# Patient Record
Sex: Male | Born: 1960
Health system: Southern US, Community
[De-identification: ages and names within clinical notes are randomized; demographics above are authoritative.]

## PROBLEM LIST (undated history)

## (undated) DIAGNOSIS — E119 Type 2 diabetes mellitus without complications: Secondary | ICD-10-CM

## (undated) DIAGNOSIS — I2510 Atherosclerotic heart disease of native coronary artery without angina pectoris: Secondary | ICD-10-CM

## (undated) HISTORY — PX: TONSILLECTOMY: SUR1361

---

## 2000-11-06 ENCOUNTER — Encounter: Payer: Self-pay | Admitting: Family Medicine

## 2005-11-29 ENCOUNTER — Ambulatory Visit: Payer: Self-pay | Admitting: Family Medicine

## 2006-01-10 ENCOUNTER — Ambulatory Visit: Payer: Self-pay | Admitting: Family Medicine

## 2006-07-19 ENCOUNTER — Telehealth: Payer: Self-pay | Admitting: Family Medicine

## 2006-07-19 ENCOUNTER — Encounter: Payer: Self-pay | Admitting: Family Medicine

## 2006-07-19 ENCOUNTER — Telehealth (INDEPENDENT_AMBULATORY_CARE_PROVIDER_SITE_OTHER): Payer: Self-pay | Admitting: *Deleted

## 2006-07-19 DIAGNOSIS — E7800 Pure hypercholesterolemia, unspecified: Secondary | ICD-10-CM

## 2006-07-30 ENCOUNTER — Encounter: Payer: Self-pay | Admitting: Family Medicine

## 2006-07-31 ENCOUNTER — Encounter: Payer: Self-pay | Admitting: Family Medicine

## 2006-07-31 LAB — CONVERTED CEMR LAB
Alkaline Phosphatase: 39 units/L (ref 39–117)
Indirect Bilirubin: 0.4 mg/dL (ref 0.0–0.9)
LDL Cholesterol: 92 mg/dL (ref 0–99)
Total Bilirubin: 0.5 mg/dL (ref 0.3–1.2)

## 2006-11-28 ENCOUNTER — Ambulatory Visit: Payer: Self-pay | Admitting: Family Medicine

## 2006-11-28 DIAGNOSIS — B079 Viral wart, unspecified: Secondary | ICD-10-CM | POA: Insufficient documentation

## 2006-11-28 DIAGNOSIS — B351 Tinea unguium: Secondary | ICD-10-CM

## 2006-12-16 ENCOUNTER — Telehealth: Payer: Self-pay | Admitting: Family Medicine

## 2007-09-11 ENCOUNTER — Ambulatory Visit: Payer: Self-pay | Admitting: Family Medicine

## 2007-09-11 DIAGNOSIS — J019 Acute sinusitis, unspecified: Secondary | ICD-10-CM

## 2007-10-02 ENCOUNTER — Telehealth: Payer: Self-pay | Admitting: Family Medicine

## 2007-10-30 ENCOUNTER — Telehealth (INDEPENDENT_AMBULATORY_CARE_PROVIDER_SITE_OTHER): Payer: Self-pay | Admitting: *Deleted

## 2007-10-30 ENCOUNTER — Encounter: Payer: Self-pay | Admitting: Family Medicine

## 2007-12-05 ENCOUNTER — Ambulatory Visit: Payer: Self-pay | Admitting: Family Medicine

## 2007-12-08 LAB — CONVERTED CEMR LAB
ALT: 32 units/L (ref 0–53)
Albumin: 4.6 g/dL (ref 3.5–5.2)
BUN: 20 mg/dL (ref 6–23)
CO2: 26 meq/L (ref 19–32)
Calcium: 9.6 mg/dL (ref 8.4–10.5)
Chloride: 106 meq/L (ref 96–112)
Cholesterol: 143 mg/dL (ref 0–200)
Creatinine, Ser: 1.23 mg/dL (ref 0.40–1.50)
Potassium: 4.5 meq/L (ref 3.5–5.3)
Total CHOL/HDL Ratio: 5.7

## 2008-01-12 ENCOUNTER — Telehealth: Payer: Self-pay | Admitting: Family Medicine

## 2008-09-30 ENCOUNTER — Ambulatory Visit: Payer: Self-pay | Admitting: Family Medicine

## 2008-09-30 DIAGNOSIS — J029 Acute pharyngitis, unspecified: Secondary | ICD-10-CM | POA: Insufficient documentation

## 2008-10-01 ENCOUNTER — Encounter: Payer: Self-pay | Admitting: Family Medicine

## 2008-10-26 ENCOUNTER — Ambulatory Visit: Payer: Self-pay | Admitting: Family Medicine

## 2008-12-14 ENCOUNTER — Encounter: Payer: Self-pay | Admitting: Family Medicine

## 2008-12-15 LAB — CONVERTED CEMR LAB
Albumin: 4.4 g/dL (ref 3.5–5.2)
CO2: 24 meq/L (ref 19–32)
Calcium: 9.5 mg/dL (ref 8.4–10.5)
Chloride: 105 meq/L (ref 96–112)
Cholesterol: 129 mg/dL (ref 0–200)
Glucose, Bld: 103 mg/dL — ABNORMAL HIGH (ref 70–99)
Sodium: 141 meq/L (ref 135–145)
Total Bilirubin: 0.4 mg/dL (ref 0.3–1.2)
Total Protein: 6.7 g/dL (ref 6.0–8.3)
Triglycerides: 248 mg/dL — ABNORMAL HIGH (ref <150)
VLDL: 50 mg/dL — ABNORMAL HIGH (ref 0–40)

## 2010-01-11 ENCOUNTER — Ambulatory Visit: Payer: Self-pay | Admitting: Family Medicine

## 2010-02-10 ENCOUNTER — Encounter: Payer: Self-pay | Admitting: Family Medicine

## 2010-02-10 LAB — CONVERTED CEMR LAB
BUN: 23 mg/dL
CO2: 24 meq/L
Chloride: 103 meq/L
Cholesterol: 127 mg/dL
Glucose, Bld: 124 mg/dL
HDL: 23 mg/dL
LDL Cholesterol: 67 mg/dL
Potassium: 4.4 meq/L
Sodium: 138 meq/L
Triglycerides: 187 mg/dL

## 2010-02-14 ENCOUNTER — Encounter: Payer: Self-pay | Admitting: Family Medicine

## 2010-08-08 NOTE — Assessment & Plan Note (Signed)
Summary: [employer] physical   Vital Signs:  Patient profile:   50 year old male Height:      69 inches Weight:      263.75 pounds BMI:     39.09 Temp:     97.2 degrees F oral Pulse rate:   69 / minute Pulse rhythm:   regular Resp:     18 per minute BP sitting:   113 / 72  (right arm) Cuff size:   large  Vitals Entered By: Mervin Kung CMA Duncan Dull) (January 11, 2010 2:36 PM) CC: Room 2  Pt here for physical, non-fasting.  Has [employer] form for completion. Pt needs refills on Tricor and Lipitor. Is Patient Diabetic? No   Primary Care Provider:  Nani Gasser MD  CC:  Room 2  Pt here for physical and non-fasting.  Has [employer] form for completion. 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  Denies any problems.  He is due for fasting labs.  He takes meds for hyperlipidemia.  Denies any cardiac or pulmonary hx.  His tetanus was updated last year.  He is obese.    Denies fam hx of colon or prostate cancer or of premature heart dz.    Needs meds RFd today and form completed.  Allergies (verified): No Known Drug Allergies  Past History:  Past Medical History: Reviewed history from 10/26/2008 and no changes required. Tonsillecotmy at age 52.   Past Surgical History: Reviewed history from 11/28/2006 and no changes required. None  Family History: Reviewed history from 10/26/2008 and no changes required. Mother with COPD, smoker Father with COPD, smoker.  MGM with MI, > 60, died.   Social History: Reviewed history from 10/26/2008 and no changes required. 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 Has 1 child.   Never Smoked Alcohol use-yes Drug use-no Regular exercise-no  Review of Systems  The patient denies anorexia, fever, weight loss, weight gain, vision loss, decreased hearing, hoarseness, chest pain, syncope, dyspnea on exertion, peripheral edema,  prolonged cough, headaches, hemoptysis, abdominal pain, melena, hematochezia, severe indigestion/heartburn, hematuria, incontinence, genital sores, muscle weakness, suspicious skin lesions, transient blindness, difficulty walking, depression, unusual weight change, abnormal bleeding, enlarged lymph nodes, angioedema, breast masses, and testicular masses.    Physical Exam  General:  obese WM in NAD Head:  normocephalic, atraumatic, and no alopecia.   Eyes:  pupils equal, pupils round, and pupils reactive to light.   Ears:  EACs patent; TMs translucent and gray with good cone of light and bony landmarks.  Nose:  no nasal discharge.   Mouth:  good dentition and pharynx pink and moist.   Neck:  no masses.   Lungs:  Normal respiratory effort, chest expands symmetrically. Lungs are clear to auscultation, no crackles or wheezes. Heart:  Normal rate and regular rhythm. S1 and S2 normal without gallop, murmur, click, rub or other extra sounds. Abdomen:  Bowel sounds positive,abdomen soft and non-tender without masses, organomegaly Msk:  no joint swelling, no joint warmth, and no redness over joints.   Pulses:  2+ radlal and PT pulses Extremities:  no LE edema Neurologic:  gait normal and DTRs symmetrical and normal.   Skin:  color normal.   Cervical Nodes:  No lymphadenopathy noted Psych:  good eye contact, not anxious appearing, and not depressed appearing.     Impression & Recommendations:  Problem # 1:  OTH GENERAL  MEDICAL EXAMINATION ADMIN PURPOSES (ICD-V70.3) [employer] physical performed.  Form completed. BP at goal.  BMI 39= class II obesity. Tdap given 2010. Update fasting labs. RFd meds.  Complete Medication List: 1)  Tricor 145 Mg Tabs (Fenofibrate) .... Take 1 tablet by mouth once a day 2)  Lipitor 40 Mg Tabs (Atorvastatin calcium) .... Take 1 tablet by mouth once a day at bedtime 3)  Fish Oil 1000 Mg Caps (Omega-3 fatty acids) .... Take 2 capsules by mouth daily. 4)  Mv  ....  By mouth daily  Other Orders: T-Comprehensive Metabolic Panel [id number]) T-Lipid Profile ([id number]) Prescriptions: LIPITOR 40 MG TABS (ATORVASTATIN CALCIUM) Take 1 tablet by mouth once a day at bedtime  #90 x 3   Entered and Authorized by:   Seymour Bars DO   Signed by:   Seymour Bars DO on 01/11/2010   Method used:   Faxed to ...       [facility]) (mail-order)             , [place]    [place]       Ph:        Fax: [phone]   RxID:   [id number] TRICOR 145 MG TABS (FENOFIBRATE) Take 1 tablet by mouth once a day  #90 x 3   Entered and Authorized by:   Seymour Bars DO   Signed by:   Seymour Bars DO on 01/11/2010   Method used:   Faxed to ...       [facility]) (mail-order)             , [place]    [place]       Ph:        Fax: [phone]   RxID:   [id number]   Current Allergies (reviewed today): No known allergies

## 2010-12-17 ENCOUNTER — Encounter: Payer: Self-pay | Admitting: Family Medicine

## 2010-12-21 ENCOUNTER — Ambulatory Visit (INDEPENDENT_AMBULATORY_CARE_PROVIDER_SITE_OTHER): Payer: Commercial Indemnity | Admitting: Family Medicine

## 2010-12-21 ENCOUNTER — Encounter: Payer: Self-pay | Admitting: Family Medicine

## 2010-12-21 VITALS — BP 123/73 | HR 61 | Ht 68.5 in | Wt 258.0 lb

## 2010-12-21 DIAGNOSIS — Z0000 Encounter for general adult medical examination without abnormal findings: Secondary | ICD-10-CM

## 2010-12-21 MED ORDER — FENOFIBRATE 145 MG PO TABS: 145.0000 mg | ORAL_TABLET | Freq: Every day | ORAL | Status: DC

## 2010-12-21 MED ORDER — ATORVASTATIN CALCIUM 40 MG PO TABS: 40.0000 mg | ORAL_TABLET | Freq: Every day | ORAL | Status: DC

## 2010-12-21 NOTE — Patient Instructions (Signed)
Work on weight loss, healthy diet, and exercise!!!

## 2010-12-21 NOTE — Progress Notes (Signed)
Subjective:    Patient ID: Robert Ward, male    DOB: June 06, 1961, 50 y.o.   MRN: [id number]  HPI He has no specific concerns today that he is here for complete physical. He does have a form for [employer] [place] that also needs to be completed. He does wear contact lenses and he says that his prescription is up-to-date. No heaves also been out of his cholesterol medications for a couple of weeks. He says they were not refilled. Otherwise he tolerates them well and denies any myalgias.  Review of Systems  No chest pain, shortness of breath, vision or hearing changes, blood in the urine or stool, abdominal pain, skin changes, or abnormal lumps or bumps.  BP 123/73  Pulse 61  Ht 5' 8.5" (1.74 m)  Wt 258 lb (117.028 kg)  BMI 38.66 kg/m2  SpO2 96%    Not on File  No past medical history on file.  Past Surgical History  Procedure Date  . Tonsillectomy 5    History   Social History  . Marital Status: Married    Spouse Name: N/A    Number of Children: 1   . Years of Education: N/A   Occupational History  . Not on file.   Social History Main Topics  . Smoking status: Never Smoker   . Smokeless tobacco: Not on file  . Alcohol Use: Yes  . Drug Use: No  . Sexually Active:      works as a Nature conservation officer, not married, 1 child, doesn't regularly exercise.   Other Topics Concern  . Not on file   Social History Narrative   1 caffeine drink per day. No regular exercise.      Family History  Problem Relation Age of Onset  . COPD Mother     + smoker  . COPD Father     + smoker, died 24  . Heart attack Maternal Grandmother 60    Current outpatient prescriptions:fish oil-omega-3 fatty acids 1000 MG capsule, Take 1 g by mouth 2 (two) times daily.  , Disp: , Rfl: ;  Multiple Vitamin (MULTIVITAMIN) tablet, Take 1 tablet by mouth daily.  , Disp: , Rfl: ;  atorvastatin (LIPITOR) 40 MG tablet, Take 40 mg by mouth daily.  , Disp: , Rfl: ;  fenofibrate (TRICOR) 145 MG  tablet, Take 145 mg by mouth daily.  , Disp: , Rfl:      Objective:   Physical Exam  Constitutional: He is oriented to person, place, and time. He appears well-developed and well-nourished.       Obese.   HENT:  Head: Normocephalic and atraumatic.  Right Ear: External ear normal.  Left Ear: External ear normal.  Nose: Nose normal.  Mouth/Throat: Oropharynx is clear and moist.  Eyes: Conjunctivae and EOM are normal. Pupils are equal, round, and reactive to light.  Neck: Normal range of motion. Neck supple. No thyromegaly present.  Cardiovascular: Normal rate, regular rhythm, normal heart sounds and intact distal pulses.   Pulmonary/Chest: Effort normal and breath sounds normal.  Abdominal: Soft. Bowel sounds are normal. He exhibits no distension and no mass. There is no tenderness. There is no rebound and no guarding.  Musculoskeletal: Normal range of motion. He exhibits no edema and no tenderness.       Neck with normal range of motion. Upper and lower images with normal range of motion and strength 5 out of 5. Back with normal range of motion.  Lymphadenopathy:  He has no cervical adenopathy.  Neurological: He is alert and oriented to person, place, and time. He has normal reflexes.  Skin: Skin is warm and dry.  Psychiatric: He has a normal mood and affect. His behavior is normal. Judgment and thought content normal.          Assessment & Plan:  CPE - Due for screening labs. He was given a lab slip indicating that any day he would like. Vaccines are uptodate.  Work on [employer] loss, healhy diet and regular exercise. I completed his form for [employer]. He does not meet the requirement but otherwise is physically able to participate.

## 2011-03-23 ENCOUNTER — Inpatient Hospital Stay (INDEPENDENT_AMBULATORY_CARE_PROVIDER_SITE_OTHER)
Admission: RE | Admit: 2011-03-23 | Discharge: 2011-03-23 | Disposition: A | Discharge status: Home / Self Care | Payer: Commercial Indemnity | Source: Ambulatory Visit | Attending: Emergency Medicine | Admitting: Emergency Medicine

## 2011-03-23 ENCOUNTER — Encounter: Payer: Self-pay | Admitting: Emergency Medicine

## 2011-03-23 DIAGNOSIS — R059 Cough, unspecified: Secondary | ICD-10-CM

## 2011-03-23 DIAGNOSIS — R05 Cough: Secondary | ICD-10-CM

## 2011-03-23 DIAGNOSIS — J069 Acute upper respiratory infection, unspecified: Secondary | ICD-10-CM

## 2011-03-23 DIAGNOSIS — E785 Hyperlipidemia, unspecified: Secondary | ICD-10-CM | POA: Insufficient documentation

## 2011-06-11 NOTE — Progress Notes (Signed)
Summary: Sore throat/stuff head /cough   Vital Signs:  Patient Profile:   50 Years Old Male CC:      sore throat, congestion, Ha x 2 days.  Height:     69 inches (175.26 cm) Weight:      260.25 pounds O2 Sat:      97 % O2 treatment:    Room Air Temp:     98.9 degrees F oral Pulse rate:   79 / minute Resp:     16 per minute BP sitting:   98 / 62  (left arm) Cuff size:   large  Vitals Entered By: Clemens Catholic LPN (March 23, 2011 9:53 AM)                  Updated Prior Medication List: TRICOR 145 MG TABS (FENOFIBRATE) Take 1 tablet by mouth once a day LIPITOR 40 MG TABS (ATORVASTATIN CALCIUM) Take 1 tablet by mouth once a day at bedtime FISH OIL 1000 MG CAPS (OMEGA-3 FATTY ACIDS) Take 2 capsules by mouth daily. * MV by mouth daily  Current Allergies (reviewed today): No known allergies History of Present Illness Chief Complaint: sore throat, congestion, Ha x 2 days.  History of Present Illness: 50 Years Old Male complains of onset of cold symptoms for 2 days.  JESSUP has been using Mucinex which is helping a little bit.  Wife is sick with bronchitis and is on Avelox which is helping her. + sore throat + cough No pleuritic pain No wheezing +nasal congestion + post-nasal drainage + sinus pain/pressure No chest congestion No itchy/red eyes No earache No hemoptysis No SOB No chills/sweats No fever No nausea No vomiting No abdominal pain No diarrhea No skin rashes No fatigue No myalgias No headache   REVIEW OF SYSTEMS Constitutional Symptoms       Complains of fatigue.     Denies fever, chills, night sweats, weight loss, and weight gain.  Eyes       Denies change in vision, eye pain, eye discharge, glasses, contact lenses, and eye surgery. Ear/Nose/Throat/Mouth       Complains of sore throat and hoarseness.      Denies hearing loss/aids, change in hearing, ear pain, ear discharge, dizziness, frequent runny nose, frequent nose bleeds, sinus problems, and  tooth pain or bleeding.  Respiratory       Complains of dry cough.      Denies productive cough, wheezing, shortness of breath, asthma, bronchitis, and emphysema/COPD.  Cardiovascular       Denies murmurs, chest pain, and tires easily with exhertion.    Gastrointestinal       Denies stomach pain, nausea/vomiting, diarrhea, constipation, blood in bowel movements, and indigestion. Genitourniary       Denies painful urination, kidney stones, and loss of urinary control. Neurological       Complains of headaches.      Denies paralysis, seizures, and fainting/blackouts. Musculoskeletal       Denies muscle pain, joint pain, joint stiffness, decreased range of motion, redness, swelling, muscle weakness, and gout.  Skin       Denies bruising, unusual mles/lumps or sores, and hair/skin or nail changes.  Psych       Denies mood changes, temper/anger issues, anxiety/stress, speech problems, depression, and sleep problems. Other Comments: pt c/o sore throat, congestion, Ha x 2 days. no fever. he has taken a herbal supp to help with congestion.   Past History:  Past Medical History:  Hyperlipidemia  Past Surgical  History:  Tonsillectomy  Family History: Reviewed history from 10/26/2008 and no changes required. Mother with COPD, smoker Father with COPD, smoker.  MGM with MI, > 60, died.   Social History: Reviewed history from 10/26/2008 and no changes required. 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 Has 1 child.   Never Smoked Alcohol use-yes Drug use-no Regular exercise-no Physical Exam General appearance: well developed, well nourished, no acute distress Ears: cerumen bilateral Nasal: mucosa pink, nonedematous, no septal deviation, turbinates normal Oral/Pharynx: clear PND, no erythema Chest/Lungs: no rales, wheezes, or rhonchi bilateral, breath sounds equal without effort Heart: regular rate and  rhythm, no murmur MSE: oriented to time,  place, and person Assessment New Problems: UPPER RESPIRATORY INFECTION, ACUTE (ICD-465.9) COUGH (ICD-786.2) HYPERLIPIDEMIA (ICD-272.4)   Plan New Medications/Changes: AMOXICILLIN 875 MG TABS (AMOXICILLIN) 1 by mouth two times a day for 7 days  #14 x 0, 03/23/2011, Hoyt Koch MD  New Orders: New Patient Level III [id number] Pulse Oximetry (single measurment) [94760] Rapid Strep [87880] T-Culture, Throat [[id number]] Planning Comments:   1)  Take the prescribed antibiotic as instructed.  Hold for a few days since this is most likely viral. 2)  Use nasal saline solution (over the counter) at least 3 times a day. 3)  Use over the counter decongestants like Zyrtec-D every 12 hours as needed to help with congestion. 4)  Can take tylenol every 6 hours or motrin every 8 hours for pain or fever. 5)  Follow up with your primary doctor  if no improvement in 5-7 days, sooner if increasing pain, fever, or new symptoms.    The patient and/or caregiver has been counseled thoroughly with regard to medications prescribed including dosage, schedule, interactions, rationale for use, and possible side effects and they verbalize understanding.  Diagnoses and expected course of recovery discussed and will return if not improved as expected or if the condition worsens. Patient and/or caregiver verbalized understanding.  Prescriptions: AMOXICILLIN 875 MG TABS (AMOXICILLIN) 1 by mouth two times a day for 7 days  #14 x 0   Entered and Authorized by:   Hoyt Koch MD   Signed by:   Hoyt Koch MD on 03/23/2011   Method used:   Print then Give to Patient   RxID:   [id number]   Orders Added: 1)  New Patient Level III [40102] 2)  Pulse Oximetry (single measurment) [94760] 3)  Rapid Strep [72536] 4)  T-Culture, Throat [[id number]]    Laboratory Results  Date/Time Received: March 23, 2011 9:56 AM  Date/Time Reported: March 23, 2011 9:56 AM   Other Tests  Rapid Strep:  negative  Kit Test Internal QC: Negative   (Normal Range: Negative)

## 2011-11-29 ENCOUNTER — Ambulatory Visit (INDEPENDENT_AMBULATORY_CARE_PROVIDER_SITE_OTHER): Payer: Commercial Indemnity | Admitting: Family Medicine

## 2011-11-29 ENCOUNTER — Encounter: Payer: Self-pay | Admitting: Family Medicine

## 2011-11-29 VITALS — BP 113/72 | HR 81 | Temp 98.7°F | Ht 68.5 in | Wt 240.0 lb

## 2011-11-29 DIAGNOSIS — R197 Diarrhea, unspecified: Secondary | ICD-10-CM

## 2011-11-29 NOTE — Progress Notes (Signed)
  Subjective:    Patient ID: Robert Ward, male    DOB: [birth year], 51 y.o.   MRN: [id number]  HPI Sat night went to [employer]. Diarrhea started Sunday morning.  Some cramping initially.  4-5 BMs per day. Stools are watery and then more mushy and now getting watery again.  Never had a fever.  No nausea.  No blood in the stool.  Took some immodium initially but not in 3 days. Went camping 2 weeks ago but didn't drink out of creek or lake.  Not been visiting hospitals.  No recent ABX. No raidation of pain into the back.    Review of Systems     Objective:   Physical Exam  Constitutional: He is oriented to person, place, and time. He appears well-developed and well-nourished.  HENT:  Head: Normocephalic and atraumatic.  Cardiovascular: Normal rate, regular rhythm and normal heart sounds.   Pulmonary/Chest: Effort normal and breath sounds normal.  Abdominal: Soft. Bowel sounds are normal. He exhibits no distension and no mass. There is tenderness. There is no rebound and no guarding.       TEnder RUQ.    Neurological: He is alert and oriented to person, place, and time.  Skin: Skin is warm and dry.  Psychiatric: He has a normal mood and affect. His behavior is normal.          Assessment & Plan:  Diarrhea - likely bacterial from contamination of food. We will do stool culture and a CBC today. If these are fairly normal then we could consider treating him with a round of Cipro for 5 days. Is not having any blood in the stools it is less likely that he has shigella or salmonella. Continue to work on staying hydrated and can eat more binding type II such as rice. Avoid products like immodium.   RUQ tenderness-he has no other symptoms consistent with gallbladder disease but if he is not better or develops abdominal pain or nausea or fever consider further evaluation with a right upper quadrant ultrasound.

## 2011-11-29 NOTE — Patient Instructions (Signed)
We will call you with your lab results. If you don't here from [place] in about a week then please give [place] a call at [phone]. Call if fever or blood in the stool.

## 2011-11-30 ENCOUNTER — Other Ambulatory Visit: Payer: Self-pay | Admitting: Family Medicine

## 2011-11-30 LAB — CBC WITH DIFFERENTIAL/PLATELET
Basophils Relative: 0 % (ref 0–1)
Eosinophils Absolute: 0.3 10*3/uL (ref 0.0–0.7)
Eosinophils Relative: 2 % (ref 0–5)
HCT: 45 % (ref 39.0–52.0)
Hemoglobin: 15.5 g/dL (ref 13.0–17.0)
MCH: 30 pg (ref 26.0–34.0)
MCHC: 34.4 g/dL (ref 30.0–36.0)
MCV: 87.2 fL (ref 78.0–100.0)
Monocytes Absolute: 1 10*3/uL (ref 0.1–1.0)
Monocytes Relative: 8 % (ref 3–12)
Neutrophils Relative %: 63 % (ref 43–77)

## 2011-11-30 MED ORDER — CIPROFLOXACIN HCL 500 MG PO TABS: 500.0000 mg | ORAL_TABLET | Freq: Two times a day (BID) | ORAL | Status: AC

## 2012-01-11 ENCOUNTER — Encounter: Payer: Self-pay | Admitting: Family Medicine

## 2012-01-11 ENCOUNTER — Ambulatory Visit (INDEPENDENT_AMBULATORY_CARE_PROVIDER_SITE_OTHER): Payer: Commercial Indemnity | Admitting: Family Medicine

## 2012-01-11 VITALS — BP 105/67 | HR 57 | Ht 68.5 in | Wt 247.0 lb

## 2012-01-11 DIAGNOSIS — E669 Obesity, unspecified: Secondary | ICD-10-CM

## 2012-01-11 DIAGNOSIS — Z0000 Encounter for general adult medical examination without abnormal findings: Secondary | ICD-10-CM

## 2012-01-11 DIAGNOSIS — Z1211 Encounter for screening for malignant neoplasm of colon: Secondary | ICD-10-CM

## 2012-01-11 LAB — LIPID PANEL: LDL Cholesterol: 138 mg/dL

## 2012-01-11 LAB — BASIC METABOLIC PANEL
BUN: 23 mg/dL — AB (ref 4–21)
Glucose: 105 mg/dL

## 2012-01-11 LAB — HEPATIC FUNCTION PANEL
AST: 20 U/L (ref 14–40)
Alkaline Phosphatase: 68 U/L (ref 25–125)
Bilirubin, Total: 0.4 mg/dL

## 2012-01-11 LAB — PSA: Total Bilirubin: 0.4 mg/dL

## 2012-01-11 NOTE — Progress Notes (Signed)
Subjective:    Patient ID: Robert Ward, male    DOB: 07-31-1960, 51 y.o.   MRN: [id number]  HPI complete physical examination He is doing well. He has no problems. He did bring a form for [employer] to be completed today. His tetanus is up-to-date. He has turned 51. He is never had a screening colonoscopy. He would like to have the blood test or PSA but not a digital rectal exam. He denies any urinary problems.   Review of Systems Comprehensive review of systems negative. BP 105/67  Pulse 57  Ht 5' 8.5" (1.74 m)  Wt 247 lb (112.038 kg)  BMI 37.01 kg/m2    No Known Allergies  History reviewed. No pertinent past medical history.  Past Surgical History  Procedure Date  . Tonsillectomy 5    History   Social History  . Marital Status: Married    Spouse Name: N/A    Number of Children: 1   . Years of Education: N/A   Occupational History  . Engineer     [employer]   Social History Main Topics  . Smoking status: Never Smoker   . Smokeless tobacco: Not on file  . Alcohol Use: Yes  . Drug Use: No  . Sexually Active: Yes -- Male partner(s)     works as a Nature conservation officer, not married, 1 child, doesn't regularly exercise.   Other Topics Concern  . Not on file   Social History Narrative   1 caffeine drink per day. Some regular exercise.      Family History  Problem Relation Age of Onset  . COPD Mother     + smoker  . COPD Father     + smoker, died 64  . Heart attack Maternal Grandmother 60    Outpatient Encounter Prescriptions as of 01/11/2012  Medication Sig Dispense Refill  . fish oil-omega-3 fatty acids 1000 MG capsule Take 1 g by mouth 2 (two) times daily.        . Multiple Vitamin (MULTIVITAMIN) tablet Take 1 tablet by mouth daily.        Marland Kitchen DISCONTD: atorvastatin (LIPITOR) 40 MG tablet Take 1 tablet (40 mg total) by mouth daily.  90 tablet  3  . DISCONTD: fenofibrate (TRICOR) 145 MG tablet Take 1 tablet (145 mg total) by mouth daily.  90 tablet  3           Objective:   Physical Exam  Constitutional: He is oriented to person, place, and time. He appears well-developed and well-nourished.       Obese   HENT:  Head: Normocephalic and atraumatic.  Right Ear: External ear normal.  Left Ear: External ear normal.  Nose: Nose normal.  Mouth/Throat: Oropharynx is clear and moist.  Eyes: Conjunctivae and EOM are normal. Pupils are equal, round, and reactive to light.  Neck: Normal range of motion. Neck supple. No thyromegaly present.  Cardiovascular: Normal rate, regular rhythm, normal heart sounds and intact distal pulses.   Pulmonary/Chest: Effort normal and breath sounds normal.  Abdominal: Soft. Bowel sounds are normal. He exhibits no distension and no mass. There is no tenderness. There is no rebound and no guarding.  Musculoskeletal: Normal range of motion. He exhibits no edema.  Lymphadenopathy:    He has no cervical adenopathy.  Neurological: He is alert and oriented to person, place, and time. He has normal reflexes.  Skin: Skin is warm and dry.  Psychiatric: He has a normal mood and affect. His behavior  is normal. Judgment and thought content normal.          Assessment & Plan:  CPE -  Doing well. Start a regular exercise program and make sure you are eating a healthy diet Try to eat 4 servings of dairy a day Your vaccines are up to date.  Form completed.  Well overdue for screening labs.   Discussed need for colon cancer screening. We discussed the options. He is okay with getting a colonoscopy.

## 2012-01-11 NOTE — Patient Instructions (Addendum)
We will call you with your lab results. If you don't here from us in about a week then please give us a call at [phone].  

## 2012-01-16 ENCOUNTER — Telehealth: Payer: Self-pay | Admitting: Family Medicine

## 2012-01-16 ENCOUNTER — Telehealth: Payer: Self-pay | Admitting: *Deleted

## 2012-01-16 NOTE — Telephone Encounter (Signed)
Left message on vm

## 2012-01-16 NOTE — Telephone Encounter (Signed)
Call patient: Glucose was borderline elevated at 105. Kidney function was stable. Collect lites were fairly normal. Liver enzymes were normal. Cholesterol was high. Total cholesterol was 215, LDL is 138. Normal Center 100. His HDL which is the good cholesterol was low. He really needs to be back on a cholesterol-lowering medication at bedtime. He did not like Lipitor we could certainly try something different. Please let me know what he would like to do. This would be in addition to low fat diet regular exercise and weight loss. His triglycerides were high at 241. Prostate test was normal.

## 2012-01-16 NOTE — Telephone Encounter (Signed)
Pt states whatever you put him on for his cholesterol will be fine.

## 2012-01-17 ENCOUNTER — Encounter: Payer: Self-pay | Admitting: *Deleted

## 2012-01-17 MED ORDER — PRAVASTATIN SODIUM 20 MG PO TABS: 20.0000 mg | ORAL_TABLET | Freq: Every day | ORAL | Status: DC

## 2012-01-17 NOTE — Telephone Encounter (Signed)
LMOM

## 2012-01-17 NOTE — Telephone Encounter (Signed)
rx sent to pharm.  Recheck lipids in 8 weeks.

## 2012-01-30 LAB — HM COLONOSCOPY

## 2012-01-31 ENCOUNTER — Encounter: Payer: Self-pay | Admitting: *Deleted

## 2012-02-07 ENCOUNTER — Encounter: Payer: Self-pay | Admitting: *Deleted

## 2012-02-21 ENCOUNTER — Ambulatory Visit (INDEPENDENT_AMBULATORY_CARE_PROVIDER_SITE_OTHER): Payer: Commercial Indemnity | Admitting: Sports Medicine

## 2012-02-21 ENCOUNTER — Encounter: Payer: Self-pay | Admitting: Sports Medicine

## 2012-02-21 VITALS — BP 109/58 | Temp 98.4°F | Wt 245.0 lb

## 2012-02-21 DIAGNOSIS — H6120 Impacted cerumen, unspecified ear: Secondary | ICD-10-CM

## 2012-02-21 NOTE — Progress Notes (Signed)
Patient ID: Robert Ward, male   DOB: 29-Jul-1960, 51 y.o.   MRN: [id number] Subjective:    CC: ears clogged up.  HPI: Robert Ward comes in, he is a very pleasant 51 year old male, but unfortunately has had an experience of both ears being clogged, and lack of hearing. He tried to clean them out earlier today, but this was ineffective. He denies any prior URI type symptoms.  His earsare mildly painful, there is no radiation to the symptoms.  He denies any headache. He also denies placing any objects into his ear. He also denies drainage.  Past medical history, Surgical history, Family history, Social history, Allergies, and medications have been entered into the medical record, reviewed, and no changes needed.   Review of Systems: No fevers, chills, night sweats, weight loss, chest pain, or shortness of breath.   Objective:    General: Well Developed, well nourished, and in no acute distress.  Neuro: Alert and oriented x3, extra-ocular muscles intact.  HEENT: Normocephalic, atraumatic, pupils equal round reactive to light, neck supple, no masses, no lymphadenopathy, thyroid nonpalpable. Both ears with cerumen impactions. Skin: Warm and dry, no rashes. Cardiac: Regular rate and rhythm, no murmurs rubs or gallops.  Respiratory: Clear to auscultation bilaterally. Not using accessory muscles, speaking in full sentences.  External canals irrigated, and cleared.  I did end up needing to remove a small amount of the cerumen with a curette on both sides.  His hearing returned completely to baseline.  Impression and Recommendations:

## 2012-02-21 NOTE — Assessment & Plan Note (Signed)
Irrigated both canals. Cerumen removed. Hearing resolved.

## 2012-05-17 ENCOUNTER — Other Ambulatory Visit: Payer: Self-pay | Admitting: Family Medicine

## 2012-08-29 ENCOUNTER — Encounter: Payer: Self-pay | Admitting: Family Medicine

## 2012-08-29 ENCOUNTER — Ambulatory Visit (INDEPENDENT_AMBULATORY_CARE_PROVIDER_SITE_OTHER): Payer: BC Managed Care – PPO

## 2012-08-29 ENCOUNTER — Ambulatory Visit (INDEPENDENT_AMBULATORY_CARE_PROVIDER_SITE_OTHER): Payer: Commercial Indemnity | Admitting: Family Medicine

## 2012-08-29 VITALS — BP 118/63 | HR 65 | Ht 68.0 in | Wt 247.0 lb

## 2012-08-29 NOTE — Progress Notes (Signed)
Subjective:    Patient ID: Robert Ward, male    DOB: [birth year], 52 y.o.   MRN: [id number]  HPI Left shoulder pain and dec ROM x 2 weeks.  No known old o new injuries.  Pain with movement on outer and back of shoulder. Pain is an aching.  Gets worse as he reaches up.  Can reach behind just fine.  No OTC meds. Not iced it.  No surgery. No neck pain.  Some pain on the medial side of elbow.    Review of Systems BP 118/63  Pulse 65  Ht 5\' 8"  (1.727 m)  Wt 247 lb (112.038 kg)  BMI 37.56 kg/m2    No Known Allergies  No past medical history on file.  Past Surgical History  Procedure Laterality Date  . Tonsillectomy  5    History   Social History  . Marital Status: Married    Spouse Name: N/A    Number of Children: 1   . Years of Education: N/A   Occupational History  . Engineer     [employer]   Social History Main Topics  . Smoking status: Never Smoker   . Smokeless tobacco: Not on file  . Alcohol Use: Yes  . Drug Use: No  . 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   Other Topics Concern  . Not on file   Social History Narrative   1 caffeine drink per day. Some regular exercise.      Family History  Problem Relation Age of Onset  . COPD Mother     + smoker  . COPD Father     + smoker, died 64  . Heart attack Maternal Grandmother 60    Outpatient Encounter Prescriptions as of 08/29/2012  Medication Sig Dispense Refill  . fish oil-omega-3 fatty acids 1000 MG capsule Take 1 g by mouth 2 (two) times daily.        . Multiple Vitamin (MULTIVITAMIN) tablet Take 1 tablet by mouth daily.        . pravastatin (PRAVACHOL) 20 MG tablet TAKE 1 TABLET DAILY  30 tablet  2   No facility-administered encounter medications on file as of 08/29/2012.          Objective:   Physical Exam  Constitutional: He is oriented to person, place, and time. He appears well-developed and  well-nourished.  Musculoskeletal:  Left shoulder with normal internal and external rotation. He has extension to about 100. He is nontender of the shoulder joint itself. No apparent swelling or dislocation of the bones. Neck with normal range of motion. Strength is 5 out of 5 in all directions. Negative empty can test. No significant discomfort with internal and external rotation. Hand strength is symmetric.  Neurological: He is alert and oriented to person, place, and time.  Skin: Skin is warm and dry.  Psychiatric: He has a normal mood and affect. His behavior is normal.          Assessment & Plan:  Left shoulder pain- he also has decreased range of motion. He can test is normal there. Consider rotator cuff injury but his strength is actually very good. Also consider first and shoulder that typically there is limited range of motion in all directions not just with extension. I would like to start with getting an x-ray to look at the anatomy and to see if there's any significant arthritis  in the shoulder. At least over the weekend I would like him to take an anti-inflammatory either naproxen or ibuprofen over-the-counter. Also showed him some exercises and range of motion stretches for his shoulder. We'll call with the results on Monday.

## 2012-08-29 NOTE — Patient Instructions (Addendum)
Recommend naproxe twice a day or Ibuprofen 600mg  three times a day. Make sure to take with food and water.

## 2012-09-02 ENCOUNTER — Encounter: Payer: Self-pay | Admitting: Family Medicine

## 2012-09-08 ENCOUNTER — Ambulatory Visit (INDEPENDENT_AMBULATORY_CARE_PROVIDER_SITE_OTHER): Payer: BC Managed Care – PPO | Admitting: Sports Medicine

## 2012-09-08 DIAGNOSIS — M7542 Impingement syndrome of left shoulder: Secondary | ICD-10-CM | POA: Insufficient documentation

## 2012-09-08 DIAGNOSIS — M25819 Other specified joint disorders, unspecified shoulder: Secondary | ICD-10-CM

## 2012-09-08 NOTE — Progress Notes (Signed)
  Subjective:    I'm seeing this patient as a consultation for:  Dr. Linford Arnold  CC: Left shoulder pain  HPI: Pain localized over the deltoid for several months, worse with overhead activities, no radiation down to the hands no numbness or pain, no neck pain, has used an occasional ibuprofen, has not had any therapies. No trauma. Moderate.  Past medical history, Surgical history, Family history not pertinant except as noted below, Social history, Allergies, and medications have been entered into the medical record, reviewed, and no changes needed.   Review of Systems: No headache, visual changes, nausea, vomiting, diarrhea, constipation, dizziness, abdominal pain, skin rash, fevers, chills, night sweats, weight loss, swollen lymph nodes, body aches, joint swelling, muscle aches, chest pain, shortness of breath, mood changes, visual or auditory hallucinations.   Objective:   General: Well Developed, well nourished, and in no acute distress.  Neuro/Psych: Alert and oriented x3, extra-ocular muscles intact, able to move all 4 extremities, sensation grossly intact. Skin: Warm and dry, no rashes noted.  Respiratory: Not using accessory muscles, speaking in full sentences, trachea midline.  Cardiovascular: Pulses palpable, no extremity edema. Abdomen: Does not appear distended. Left Shoulder: Inspection reveals no abnormalities, atrophy or asymmetry. Palpation is normal with no tenderness over AC joint or bicipital groove. ROM is full in all planes. Rotator cuff strength weak to abduction. Positive Neer's, Hawkins, and Empty Can signs. Speeds and Yergason's tests normal. No labral pathology noted with negative Obrien's, negative clunk and good stability. Normal scapular function observed. No painful arc and no drop arm sign. No apprehension sign. Impression and Recommendations:   This case required medical decision making of moderate complexity.

## 2012-09-08 NOTE — Assessment & Plan Note (Signed)
Continue ibuprofen prescribed by primary care physician. One visit with the physical therapist to teach exercises, and home exercises and working specifically on the rotator cuff with his personal trainer. He should avoid overhead exercises. Return to see me in 4 weeks, injection if no better.

## 2012-09-17 ENCOUNTER — Encounter: Payer: Self-pay | Admitting: Sports Medicine

## 2012-09-19 ENCOUNTER — Other Ambulatory Visit: Payer: Self-pay

## 2012-09-19 MED ORDER — PRAVASTATIN SODIUM 20 MG PO TABS: 20.0000 mg | ORAL_TABLET | Freq: Every day | ORAL | Status: DC

## 2012-09-24 ENCOUNTER — Ambulatory Visit (INDEPENDENT_AMBULATORY_CARE_PROVIDER_SITE_OTHER): Payer: BC Managed Care – PPO | Admitting: Physical Therapy

## 2012-09-24 DIAGNOSIS — M6281 Muscle weakness (generalized): Secondary | ICD-10-CM

## 2012-09-24 DIAGNOSIS — M7580 Other shoulder lesions, unspecified shoulder: Secondary | ICD-10-CM

## 2012-09-24 DIAGNOSIS — M25619 Stiffness of unspecified shoulder, not elsewhere classified: Secondary | ICD-10-CM

## 2012-09-24 DIAGNOSIS — M25819 Other specified joint disorders, unspecified shoulder: Secondary | ICD-10-CM

## 2012-09-30 ENCOUNTER — Encounter: Payer: BC Managed Care – PPO | Admitting: Physical Therapy

## 2012-09-30 DIAGNOSIS — M25619 Stiffness of unspecified shoulder, not elsewhere classified: Secondary | ICD-10-CM

## 2012-09-30 DIAGNOSIS — M6281 Muscle weakness (generalized): Secondary | ICD-10-CM

## 2012-09-30 DIAGNOSIS — M7580 Other shoulder lesions, unspecified shoulder: Secondary | ICD-10-CM

## 2012-10-08 ENCOUNTER — Encounter (INDEPENDENT_AMBULATORY_CARE_PROVIDER_SITE_OTHER): Payer: BC Managed Care – PPO | Admitting: Physical Therapy

## 2012-10-08 DIAGNOSIS — M6281 Muscle weakness (generalized): Secondary | ICD-10-CM

## 2012-10-08 DIAGNOSIS — M25819 Other specified joint disorders, unspecified shoulder: Secondary | ICD-10-CM

## 2012-10-08 DIAGNOSIS — M7580 Other shoulder lesions, unspecified shoulder: Secondary | ICD-10-CM

## 2012-10-08 DIAGNOSIS — M25619 Stiffness of unspecified shoulder, not elsewhere classified: Secondary | ICD-10-CM

## 2012-10-15 ENCOUNTER — Encounter (INDEPENDENT_AMBULATORY_CARE_PROVIDER_SITE_OTHER): Payer: BC Managed Care – PPO | Admitting: Physical Therapy

## 2012-10-15 DIAGNOSIS — M25619 Stiffness of unspecified shoulder, not elsewhere classified: Secondary | ICD-10-CM

## 2012-10-15 DIAGNOSIS — M6281 Muscle weakness (generalized): Secondary | ICD-10-CM

## 2012-10-15 DIAGNOSIS — M7580 Other shoulder lesions, unspecified shoulder: Secondary | ICD-10-CM

## 2013-01-01 ENCOUNTER — Other Ambulatory Visit: Payer: Self-pay | Admitting: Family Medicine

## 2013-01-02 MED ORDER — PRAVASTATIN SODIUM 20 MG PO TABS: 20.0000 mg | ORAL_TABLET | Freq: Every day | ORAL | Status: DC

## 2013-01-08 ENCOUNTER — Encounter: Payer: Self-pay | Admitting: Family Medicine

## 2013-01-08 ENCOUNTER — Ambulatory Visit (INDEPENDENT_AMBULATORY_CARE_PROVIDER_SITE_OTHER): Payer: BC Managed Care – PPO | Admitting: Family Medicine

## 2013-01-08 VITALS — BP 127/73 | HR 76 | Ht 69.0 in | Wt 252.0 lb

## 2013-01-08 DIAGNOSIS — Z0000 Encounter for general adult medical examination without abnormal findings: Secondary | ICD-10-CM

## 2013-01-08 DIAGNOSIS — R3915 Urgency of urination: Secondary | ICD-10-CM

## 2013-01-08 DIAGNOSIS — E785 Hyperlipidemia, unspecified: Secondary | ICD-10-CM

## 2013-01-08 NOTE — Progress Notes (Signed)
Subjective:    Patient ID: Robert Ward, male    DOB: [birth year], 52 y.o.   MRN: [id number]  HPI  Here for complete physical today and for form to be completed for preparticipation for [employer]. He is not participating in any high-risk adventure activities or scuba diving.  Hyperlipidemia - Tolerating his statin No myalgias. No problems.   Review of Systems Comprehensive review of systems is negative.  BP 127/73  Pulse 76  Ht 5\' 9"  (1.753 m)  Wt 252 lb (114.306 kg)  BMI 37.2 kg/m2    No Known Allergies  History reviewed. No pertinent past medical history.  Past Surgical History  Procedure Laterality Date  . Tonsillectomy  5    History   Social History  . Marital Status: Married    Spouse Name: N/A    Number of Children: 1   . Years of Education: N/A   Occupational History  . Engineer     [employer]   Social History Main Topics  . Smoking status: Never Smoker   . Smokeless tobacco: Not on file  . Alcohol Use: Yes  . Drug Use: No  . 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   Other Topics Concern  . Not on file   Social History Narrative   1 caffeine drink per day. Some regular exercise.      Family History  Problem Relation Age of Onset  . COPD Mother     + smoker  . COPD Father     + smoker, died 15  . Heart attack Maternal Grandmother 60    Outpatient Encounter Prescriptions as of 01/08/2013  Medication Sig Dispense Refill  . fish oil-omega-3 fatty acids 1000 MG capsule Take 1 g by mouth 2 (two) times daily.        . Multiple Vitamin (MULTIVITAMIN) tablet Take 1 tablet by mouth daily.        . pravastatin (PRAVACHOL) 20 MG tablet Take 1 tablet (20 mg total) by mouth daily.  90 tablet  0   No facility-administered encounter medications on file as of 01/08/2013.          Objective:   Physical Exam  Constitutional: He is oriented to person, place, and  time. He appears well-developed and well-nourished.  HENT:  Head: Normocephalic and atraumatic.  Right Ear: External ear normal.  Left Ear: External ear normal.  Nose: Nose normal.  Mouth/Throat: Oropharynx is clear and moist.  Eyes: Conjunctivae and EOM are normal. Pupils are equal, round, and reactive to light.  Neck: Normal range of motion. Neck supple. No thyromegaly present.  Cardiovascular: Normal rate, regular rhythm, normal heart sounds and intact distal pulses.   Pulmonary/Chest: Effort normal and breath sounds normal.  Abdominal: Soft. Bowel sounds are normal. He exhibits no distension and no mass. There is no tenderness. There is no rebound and no guarding.  Musculoskeletal: Normal range of motion.  Lymphadenopathy:    He has no cervical adenopathy.  Neurological: He is alert and oriented to person, place, and time. He has normal reflexes.  Skin: Skin is warm and dry.  Psychiatric: He has a normal mood and affect. His behavior is normal. Judgment and thought content normal.          Assessment & Plan:  CPE- Keep up a regular exercise program and make sure you are eating a healthy diet Try to eat  4 servings of dairy a day, or if you are lactose intolerant take a calcium with vitamin D daily.  Your vaccines are up to date.  Will do screening labs.  I did complete his form for participation also gave him a letter signed that his shoulder injury from March is improved and should not cause any difficulties and he should be able to participate fully.  Hyperlipidemia-due to recheck lipids and liver enzymes. We have printed a lab slip for him back in January and he never went. Hopefully he will do this time. Encouraged him to do this at work and make sure that it's safe for him to continue his current medications.  Urinary urgency-he has noted some urgency but then when he gets home from work. He says will start to change of his work clothes and noticed that he has to go  immediately. He said a lot of times his bladder isn't even completely full. He says he and he will notice this occasionally at other times but not often. He says he usually wakes up about once at night to urinate. No hematuria or dysuria. Will check urinalysis on bloodwork. Explained to him that this could be the beginning of an overactive bladder and that there are medications that can help with this. Encouraged him to empty his bladder on a scheduled basis.

## 2013-01-20 ENCOUNTER — Telehealth: Payer: Self-pay | Admitting: *Deleted

## 2013-01-20 ENCOUNTER — Other Ambulatory Visit: Payer: Self-pay | Admitting: *Deleted

## 2013-01-20 DIAGNOSIS — Z0000 Encounter for general adult medical examination without abnormal findings: Secondary | ICD-10-CM

## 2013-01-20 LAB — LIPID PANEL
LDL Cholesterol: 101 mg/dL
Triglycerides: 339 mg/dL — AB (ref 40–160)

## 2013-01-20 LAB — BASIC METABOLIC PANEL
BUN: 18 mg/dL (ref 4–21)
Glucose: 121 mg/dL

## 2013-01-20 LAB — HEPATIC FUNCTION PANEL
ALT: 40 U/L (ref 10–40)
Alkaline Phosphatase: 63 U/L (ref 25–125)
Bilirubin, Total: 0.3 mg/dL

## 2013-01-20 NOTE — Telephone Encounter (Signed)
Suzanne from [facility] called [phone] and stated that pt had come by their office and had his labs drawn however due to the labs having [facility] as the resulting agency she asked if I would change this to reflect [facility] and to also inform me that the UA was too old to be resulted. I reordered the blood work and faxed it to [phone].Ryli Standlee [place]  

## 2013-01-20 NOTE — Progress Notes (Signed)
Rosalita Chessman from [facility] called [phone] and stated that pt had come by their office and had his labs drawn however due to the labs having [facility] as the resulting agency she asked if I would change this to reflect [facility] and to also inform me that the UA was too old to be resulted. I reordered the blood work and faxed it to [phone].Loralee Pacas [place]

## 2013-01-21 ENCOUNTER — Telehealth: Payer: Self-pay | Admitting: Family Medicine

## 2013-01-21 NOTE — Telephone Encounter (Signed)
Lvm.Ladene Allocca [place]  

## 2013-01-21 NOTE — Telephone Encounter (Signed)
Call patient: We got his complete metabolic [facility]. It looks normal except his sugar was high. Would like him to schedule followup appointment in the next month so that we can test him for diabetes. Also I do not see the results of the lipid panel so to see if we can call [facility] and see if they can relax those. I did not see the urine as well we had ordered this for urinary urgency. He can always do the urine when he follows up for diabetes testing if he would like. That way we can just get a sample here in the office.

## 2013-01-22 ENCOUNTER — Telehealth: Payer: Self-pay | Admitting: Family Medicine

## 2013-01-22 MED ORDER — PRAVASTATIN SODIUM 40 MG PO TABS: 40.0000 mg | ORAL_TABLET | Freq: Every day | ORAL | Status: DC

## 2013-01-22 NOTE — Telephone Encounter (Signed)
Call patient: Total cholesterol and LDL look okay, but LDL is borderline.  . Triglycerides are still high at 339. In the good cholesterol, HDL is very low.let sincrease pravastatin 40 mg. I will send over new prescription to the pharmacy and we can recheck blood work in 2-3 months. Prostate test is normal.

## 2013-01-23 NOTE — Telephone Encounter (Signed)
Pt informed.Robert Ward  

## 2013-02-03 ENCOUNTER — Encounter: Payer: Self-pay | Admitting: *Deleted

## 2013-05-14 ENCOUNTER — Other Ambulatory Visit: Payer: Self-pay

## 2013-05-17 ENCOUNTER — Other Ambulatory Visit: Payer: Self-pay | Admitting: Family Medicine

## 2013-08-23 ENCOUNTER — Other Ambulatory Visit: Payer: Self-pay | Admitting: Family Medicine

## 2013-11-29 ENCOUNTER — Other Ambulatory Visit: Payer: Self-pay | Admitting: Family Medicine

## 2013-12-31 ENCOUNTER — Encounter: Payer: Self-pay | Admitting: Family Medicine

## 2013-12-31 ENCOUNTER — Ambulatory Visit (INDEPENDENT_AMBULATORY_CARE_PROVIDER_SITE_OTHER): Payer: BC Managed Care – PPO | Admitting: Family Medicine

## 2013-12-31 VITALS — BP 112/65 | HR 66 | Ht 68.0 in | Wt 247.0 lb

## 2013-12-31 DIAGNOSIS — Z0000 Encounter for general adult medical examination without abnormal findings: Secondary | ICD-10-CM

## 2013-12-31 DIAGNOSIS — E785 Hyperlipidemia, unspecified: Secondary | ICD-10-CM

## 2013-12-31 DIAGNOSIS — R7309 Other abnormal glucose: Secondary | ICD-10-CM

## 2013-12-31 DIAGNOSIS — K644 Residual hemorrhoidal skin tags: Secondary | ICD-10-CM

## 2013-12-31 LAB — POCT GLYCOSYLATED HEMOGLOBIN (HGB A1C): Hemoglobin A1C: 6.3

## 2013-12-31 MED ORDER — LIDOCAINE-HYDROCORTISONE ACE 3-0.5 % RE CREA: 1.0000 | TOPICAL_CREAM | Freq: Two times a day (BID) | RECTAL | Status: DC | PRN

## 2013-12-31 MED ORDER — PRAVASTATIN SODIUM 40 MG PO TABS: 40.0000 mg | ORAL_TABLET | Freq: Every day | ORAL | Status: DC

## 2013-12-31 NOTE — Progress Notes (Addendum)
Subjective:    Patient ID: Robert Ward, male    DOB: 06/30/1961, 53 y.o.   MRN: [id number]  HPI Here for CPE today.  No specific complaints.  He has eye exam earlier this morning. He does recontact wheezes. He also needs a complete a form for [employer] today. His tetanus is up-to-date. He is having symptoms with his hemorrhoids and at that point he would like to do something about it. Previously which is bothered him a few times a year and now he's having persistent problems for several months.  Hyperlipidemia-tolerating statin well without any myalgias or side effects. Review of Systems Comprehensive review of systems is negative.  BP 112/65  Pulse 66  Ht 5\' 8"  (1.727 m)  Wt 247 lb (112.038 kg)  BMI 37.56 kg/m2    No Known Allergies  No past medical history on file.  Past Surgical History  Procedure Laterality Date  . Tonsillectomy  5    History   Social History  . Marital Status: Married    Spouse Name: N/A    Number of Children: 1   . Years of Education: N/A   Occupational History  . Engineer     [employer]   Social History Main Topics  . Smoking status: Never Smoker   . Smokeless tobacco: Not on file  . Alcohol Use: Yes  . Drug Use: No  . 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   Other Topics Concern  . Not on file   Social History Narrative   1 caffeine drink per day. Some regular exercise.      Family History  Problem Relation Age of Onset  . COPD Mother     + smoker  . COPD Father     + smoker, died 7565  . Heart attack Maternal Grandmother 60    Outpatient Encounter Prescriptions as of 12/31/2013  Medication Sig  . fish oil-omega-3 fatty acids 1000 MG capsule Take 1 g by mouth 2 (two) times daily.    . Multiple Vitamin (MULTIVITAMIN) tablet Take 1 tablet by mouth daily.    . pravastatin (PRAVACHOL) 40 MG tablet Take 1 tablet (40 mg total) by mouth  daily.  . [DISCONTINUED] pravastatin (PRAVACHOL) 40 MG tablet Take 1 tablet (40 mg total) by mouth daily. MUST MAKE APPOINTMENT BEFORE ANY FURTHER REFILLS          Objective:   Physical Exam  Constitutional: He is oriented to person, place, and time. He appears well-developed and well-nourished.  HENT:  Head: Normocephalic and atraumatic.  Right Ear: External ear normal.  Left Ear: External ear normal.  Nose: Nose normal.  Mouth/Throat: Oropharynx is clear and moist.  Eyes: Conjunctivae and EOM are normal. Pupils are equal, round, and reactive to light.  Neck: Normal range of motion. Neck supple. No thyromegaly present.  Cardiovascular: Normal rate, regular rhythm, normal heart sounds and intact distal pulses.   Pulmonary/Chest: Effort normal and breath sounds normal.  Abdominal: Soft. Bowel sounds are normal. He exhibits no distension and no mass. There is no tenderness. There is no rebound and no guarding.  Musculoskeletal: Normal range of motion.  Lymphadenopathy:    He has no cervical adenopathy.  Neurological: He is alert and oriented to person, place, and time. He has normal reflexes.  Skin: Skin is warm and dry.  Psychiatric: He has a normal mood and  affect. His behavior is normal. Judgment and thought content normal.          Assessment & Plan:  CPE Keep up a regular exercise program and make sure you are eating a healthy diet Try to eat 4 servings of dairy a day, or if you are lactose intolerant take a calcium with vitamin D daily.  Your vaccines are up to date.  Discussed need for shingles vaccine. Handout provided. Encouraged him to check with his insurance and coverage before we get the vaccine. Check PSA for screening: Cancer.  Hyperlipidemia-tolerating statin well without any side effects or myalgias. Due to repeat lipid panel and liver enzymes.  Hemorrhoids-discussed options. Recommend surgical consult for further evaluation and treatment. In the meantime we  can use prescription topical treatment for symptom relief.  IFG - new diagnosis. His blood sugar was elevated last July and he was supposed to followup to recheck and he did not. We didn't and hemoglobin A1c today. His A1c was 6.3 in the impaired fasting glucose range. Discussed diagnosis. Continue work on diet and exercise and weight loss. Recheck A1c in 6 months.

## 2013-12-31 NOTE — Patient Instructions (Addendum)
Keep up a regular exercise program and make sure you are eating a healthy diet Try to eat 4 servings of dairy a day, or if you are lactose intolerant take a calcium with vitamin D daily.  Your vaccines are up to date.  Diets for Diabetes, Food Labeling Look at food labels to help you decide how much of a product you can eat. You will want to check the amount of total carbohydrate in a serving to see how the food fits into your meal plan. In the list of ingredients, the ingredient present in the largest amount by weight must be listed first, followed by the other ingredients in descending order. STANDARD OF IDENTITY Most products have a list of ingredients. However, foods that the Food and Drug Administration (FDA) has given a standard of identity do not need a list of ingredients. A standard of identity means that a food must contain certain ingredients if it is called a particular name. Examples are mayonnaise, peanut butter, ketchup, jelly, and cheese. LABELING TERMS There are many terms found on food labels. Some of these terms have specific definitions. Some terms are regulated by the FDA, and the FDA has clearly specified how they can be used. Others are not regulated or well-defined and can be misleading and confusing. SPECIFICALLY DEFINED TERMS Nutritive Sweetener.  A sweetener that contains calories,such as table sugar or honey. Nonnutritive Sweetener.  A sweetener with few or no calories,such as saccharin, aspartame, sucralose, and cyclamate. LABELING TERMS REGULATED BY THE FDA Free.  The product contains only a tiny or small amount of fat, cholesterol, sodium, sugar, or calories. For example, a "fat-free" product will contain less than 0.5 g of fat per serving. Low.  A food described as "low" in fat, saturated fat, cholesterol, sodium, or calories could be eaten fairly often without exceeding dietary guidelines. For example, "low in fat" means no more than 3 g of fat per  serving. Lean.  "Lean" and "extra lean" are U.S. Department of Agriculture Architect(USDA) terms for use on meat and poultry products. "Lean" means the product contains less than 10 g of fat, 4 g of saturated fat, and 95 mg of cholesterol per serving. "Lean" is not as low in fat as a product labeled "low." Extra Lean.  "Extra lean" means the product contains less than 5 g of fat, 2 g of saturated fat, and 95 mg of cholesterol per serving. While "extra lean" has less fat than "lean," it is still higher in fat than a product labeled "low." Reduced, Less, Fewer.  A diet product that contains 25% less of a nutrient or calories than the regular version. For example, hot dogs might be labeled "25% less fat than our regular hot dogs." Light/Lite.  A diet product that contains  fewer calories or  the fat of the original. For example, "light in sodium" means a product with  the usual sodium. More.  One serving contains at least 10% more of the daily value of a vitamin, mineral, or fiber than usual. Good Source Of.  One serving contains 10% to 19% of the daily value for a particular vitamin, mineral, or fiber. Excellent Source Of.  One serving contains 20% or more of the daily value for a particular nutrient. Other terms used might be "high in" or "rich in." Enriched or Fortified.  The product contains added vitamins, minerals, or protein. Nutrition labeling must be used on enriched or fortified foods. Imitation.  The product has been altered so that  it is lower in protein, vitamins, or minerals than the usual food,such as imitation peanut butter. Total Fat.  The number listed is the total of all fat found in a serving of the product. Under total fat, food labels must list saturated fat and trans fat, which are associated with raising bad cholesterol and an increased risk of heart blood vessel disease. Saturated Fat.  Mainly fats from animal-based sources. Some examples are red meat, cheese, cream,  whole milk, and coconut oil. Trans Fat.  Found in some fried snack foods, packaged foods, and fried restaurant foods. It is recommended you eat as close to 0 g of trans fat as possible, since it raises bad cholesterol and lowers good cholesterol. Polyunsaturated and Monounsaturated Fats.  More healthful fats. These fats are from plant sources. Total Carbohydrate.  The number of carbohydrate grams in a serving of the product. Under total carbohydrate are listed the other carbohydrate sources, such as dietary fiber and sugars. Dietary Fiber.  A carbohydrate from plant sources. Sugars.  Sugars listed on the label contain all naturally occurring sugars as well as added sugars. LABELING TERMS NOT REGULATED BY THE FDA Sugarless.  Table sugar (sucrose) has not been added. However, the manufacturer may use another form of sugar in place of sucrose to sweeten the product. For example, sugar alcohols are used to sweeten foods. Sugar alcohols are a form of sugar but are not table sugar. If a product contains sugar alcohols in place of sucrose, it can still be labeled "sugarless." Low Salt, Salt-Free, Unsalted, No Salt, No Salt Added, Without Added Salt.  Food that is usually processed with salt has been made without salt. However, the food may contain sodium-containing additives, such as preservatives, leavening agents, or flavorings. Natural.  This term has no legal meaning. Organic.  Foods that are certified as organic have been inspected and approved by the USDA to ensure they are produced without pesticides, fertilizers containing synthetic ingredients, bioengineering, or ionizing radiation. Document Released: 06/28/2003 Document Revised: 09/17/2011 Document Reviewed: 01/13/2009 [facility] Patient Information 2015 [place]. This information is not intended to replace advice given to you by your health care provider. Make sure you discuss any questions you have with your health care  provider.

## 2013-12-31 NOTE — Addendum Note (Signed)
Addended by: Nani GasserMETHENEY, CATHERINE D on: 12/31/2013 03:59 PM   Modules accepted: Orders

## 2013-12-31 NOTE — Addendum Note (Signed)
Addended by: Deno EtienneBARKLEY, TONYA L on: 12/31/2013 03:57 PM   Modules accepted: Orders

## 2014-01-01 ENCOUNTER — Encounter: Payer: BC Managed Care – PPO | Admitting: Family Medicine

## 2014-01-01 LAB — BASIC METABOLIC PANEL
BUN: 20 mg/dL (ref 4–21)
CREATININE: 0.9 mg/dL (ref 0.6–1.3)
Glucose: 136 mg/dL
Potassium: 4.2 mmol/L (ref 3.4–5.3)
Sodium: 143 mmol/L (ref 137–147)

## 2014-01-01 LAB — LIPID PANEL
Cholesterol: 159 mg/dL (ref 0–200)
HDL: 26 mg/dL — AB (ref 35–70)
LDL Cholesterol: 70 mg/dL
TRIGLYCERIDES: 316 mg/dL — AB (ref 40–160)

## 2014-01-01 LAB — HEPATIC FUNCTION PANEL
ALK PHOS: 63 U/L (ref 25–125)
ALT: 49 U/L — AB (ref 10–40)
AST: 84 U/L — AB (ref 14–40)
BILIRUBIN, TOTAL: 0.3 mg/dL

## 2014-01-12 ENCOUNTER — Telehealth: Payer: Self-pay | Admitting: Family Medicine

## 2014-01-12 NOTE — Telephone Encounter (Signed)
The patient and let him know that I did receive a copy of his blood work. His liver enzymes were elevated. Stop any alcohol or Tylenol products and recommend repeat liver enzymes in about 2 weeks. Continue to work on low fat diet as well. Total cholesterol and LDL cholesterol looks great. Symmetrical strides are high at 316. HDL which is the good cholesterol is also low. PSA which is the prostate test is normal.

## 2014-01-13 NOTE — Telephone Encounter (Signed)
Pt informed.Robert Ward Robert Ward  

## 2014-01-15 ENCOUNTER — Ambulatory Visit (INDEPENDENT_AMBULATORY_CARE_PROVIDER_SITE_OTHER): Payer: BC Managed Care – PPO | Admitting: General Surgery

## 2014-01-15 ENCOUNTER — Encounter (INDEPENDENT_AMBULATORY_CARE_PROVIDER_SITE_OTHER): Payer: Self-pay | Admitting: General Surgery

## 2014-01-15 VITALS — BP 124/76 | HR 64 | Temp 98.7°F | Ht 69.0 in | Wt 249.0 lb

## 2014-01-15 DIAGNOSIS — K644 Residual hemorrhoidal skin tags: Secondary | ICD-10-CM

## 2014-01-15 MED ORDER — HYDROCORTISONE ACETATE 25 MG RE SUPP: 25.0000 mg | Freq: Two times a day (BID) | RECTAL | Status: DC

## 2014-01-15 NOTE — Progress Notes (Signed)
Subjective:     Patient ID: Robert Ward, male   DOB: 12-03-1960, 53 y.o.   MRN: [id number]  HPI The patient is a 53 year old male who is referred by Dr. Jarome Ward for evaluation of external hemorrhoids. The patient has had these for approximately 10+ years. The patient states over the last several months or complications to include itching and pain. Patient also states his blood in his stool.  The patient states he does not take any supplemental fiber. He is not at this time because of her for hydration. He does not fit on the commode long. According to his wife in addition he was in usual first bowel movement.  Patient colonoscopy 2 years ago which was normal aside from hemorrhoids  Review of Systems  Constitutional: Negative.   HENT: Negative.   Eyes: Negative.   Respiratory: Negative.   Cardiovascular: Negative.   Gastrointestinal: Negative.   Endocrine: Negative.   Neurological: Negative.        Objective:   Physical Exam  Constitutional: He is oriented to person, place, and time. He appears well-developed and well-nourished.  HENT:  Head: Normocephalic and atraumatic.  Eyes: Conjunctivae and EOM are normal. Pupils are equal, round, and reactive to light.  Neck: Normal range of motion. Neck supple.  Cardiovascular: Normal rate, regular rhythm and normal heart sounds.   Pulmonary/Chest: Effort normal and breath sounds normal.  Abdominal: Soft. Bowel sounds are normal.  Genitourinary:     Musculoskeletal: Normal range of motion.  Neurological: He is alert and oriented to person, place, and time.  Skin: Skin is warm and dry.       Assessment:     53 year old male with grade 1 external hemorrhoids     Plan:     1. Discussed with him the pathophysiology of hemorrhoids. The patient also do some weightlifting I [facility]. 2. I discussed with him to increase his fiber supplement with either Metamucil or similar fiber. Also discussed  increase his hydration. 3. The patient follow back up in 2 months to see how the above has helped. 4. Will give the patient prescription for Anusol Suppositories to help at this time with acute inflammation.

## 2014-01-28 ENCOUNTER — Encounter: Payer: Self-pay | Admitting: Family Medicine

## 2014-03-16 ENCOUNTER — Encounter (INDEPENDENT_AMBULATORY_CARE_PROVIDER_SITE_OTHER): Payer: BC Managed Care – PPO | Admitting: General Surgery

## 2014-05-05 ENCOUNTER — Encounter (INDEPENDENT_AMBULATORY_CARE_PROVIDER_SITE_OTHER): Payer: Self-pay | Admitting: General Surgery

## 2015-01-24 ENCOUNTER — Ambulatory Visit (INDEPENDENT_AMBULATORY_CARE_PROVIDER_SITE_OTHER): Payer: BLUE CROSS/BLUE SHIELD | Admitting: Family Medicine

## 2015-01-24 ENCOUNTER — Encounter: Payer: Self-pay | Admitting: Family Medicine

## 2015-01-24 VITALS — BP 110/68 | HR 67 | Ht 69.0 in | Wt 256.0 lb

## 2015-01-24 DIAGNOSIS — Z125 Encounter for screening for malignant neoplasm of prostate: Secondary | ICD-10-CM | POA: Diagnosis not present

## 2015-01-24 DIAGNOSIS — E785 Hyperlipidemia, unspecified: Secondary | ICD-10-CM

## 2015-01-24 DIAGNOSIS — Z0000 Encounter for general adult medical examination without abnormal findings: Secondary | ICD-10-CM

## 2015-01-24 NOTE — Progress Notes (Signed)
Subjective:    Patient ID: Robert Ward, male    DOB: 03-17-1961, 54 y.o.   MRN: [id number]  HPI Here today for complete physical. He also needs participation form filled out for [employer].  No regular exercise.    Hyperlipidemia-he stopped his pravastatin on his own.   Review of Systems Comprehensive ROS is neg.   BP 110/68 mmHg  Pulse 67  Ht  (1.753 m)  Wt 256 lb (116.121 kg)  BMI 37.79 kg/m2    No Known Allergies  History reviewed. No pertinent past medical history.  Past Surgical History  Procedure Laterality Date  . Tonsillectomy  5    History   Social History  . Marital Status: Married    Spouse Name: N/A  . Number of Children: 1   . Years of Education: N/A   Occupational History  . Engineer     [employer]   Social History Main Topics  . Smoking status: Never Smoker   . Smokeless tobacco: Not on file  . Alcohol Use: Yes  . Drug Use: No  . Sexual Activity:    Partners: Female     Comment: works as a Nature conservation officer, not married, 1 child, doesn't regularly exercise.   Other Topics Concern  . Not on file   Social History Narrative   1 caffeine drink per day. Some regular exercise.      Family History  Problem Relation Age of Onset  . COPD Mother     + smoker  . COPD Father     + smoker, died 60  . Heart attack Maternal Grandmother 60    Outpatient Encounter Prescriptions as of 01/24/2015  Medication Sig  . [DISCONTINUED] fish oil-omega-3 fatty acids 1000 MG capsule Take 1 g by mouth 2 (two) times daily.    . [DISCONTINUED] hydrocortisone (ANUSOL-HC) 25 MG suppository Place 1 suppository (25 mg total) rectally 2 (two) times daily.  . [DISCONTINUED] lidocaine-hydrocortisone (ANAMANTEL HC) 3-0.5 % CREA Place 1 Applicatorful rectally 2 (two) times daily as needed.  . [DISCONTINUED] Multiple Vitamin (MULTIVITAMIN) tablet Take 1 tablet by mouth daily.    . [DISCONTINUED] pravastatin (PRAVACHOL) 40 MG tablet Take 1 tablet (40 mg total) by  mouth daily.   No facility-administered encounter medications on file as of 01/24/2015.          Objective:   Physical Exam  Constitutional: He is oriented to person, place, and time. He appears well-developed and well-nourished.  HENT:  Head: Normocephalic and atraumatic.  Right Ear: External ear normal.  Left Ear: External ear normal.  Nose: Nose normal.  Mouth/Throat: Oropharynx is clear and moist.  Eyes: Conjunctivae and EOM are normal. Pupils are equal, round, and reactive to light.  Neck: Normal range of motion. Neck supple. No thyromegaly present.  Cardiovascular: Normal rate, regular rhythm, normal heart sounds and intact distal pulses.   Pulmonary/Chest: Effort normal and breath sounds normal.  Abdominal: Soft. Bowel sounds are normal. He exhibits no distension and no mass. There is no tenderness. There is no rebound and no guarding.  Musculoskeletal: Normal range of motion.  Lymphadenopathy:    He has no cervical adenopathy.  Neurological: He is alert and oriented to person, place, and time. He has normal reflexes.  Skin: Skin is warm and dry.  Psychiatric: He has a normal mood and affect. His behavior is normal. Judgment and thought content normal.          Assessment & Plan:  CPE- Keep  up a regular exercise program and make sure you are eating a healthy diet Try to eat 4 servings of dairy a day, or if you are lactose intolerant take a calcium with vitamin D daily.  Your vaccines are up to date.    BMI 37- discussed getting back on track with diet and exercise. He has gained 6 lb since last year and really needs to work on getting this back down.   Hyperlipidemia-now off statin. Due to recheck lipid levels this year. We'll call results once available.  Tetanus vaccine is up-to-date. Colonoscopy is up-to-date.

## 2015-01-24 NOTE — Patient Instructions (Signed)
Keep up a regular exercise program and make sure you are eating a healthy diet Try to eat 4 servings of dairy a day, or if you are lactose intolerant take a calcium with vitamin D daily.  Your vaccines are up to date.   

## 2015-01-28 LAB — HEPATIC FUNCTION PANEL
ALK PHOS: 70 U/L (ref 25–125)
ALT: 32 U/L (ref 10–40)
AST: 21 U/L (ref 14–40)

## 2015-01-28 LAB — BASIC METABOLIC PANEL
BUN: 18 mg/dL (ref 4–21)
CREATININE: 1 mg/dL (ref 0.6–1.3)
Glucose: 128 mg/dL
Potassium: 4.9 mmol/L (ref 3.4–5.3)
SODIUM: 139 mmol/L (ref 137–147)

## 2015-01-28 LAB — LIPID PANEL
CHOLESTEROL: 187 mg/dL (ref 0–200)
HDL: 24 mg/dL — AB (ref 35–70)
Triglycerides: 409 mg/dL — AB (ref 40–160)

## 2015-01-29 LAB — HEPATITIS C ANTIBODY

## 2015-01-29 LAB — PSA: PSA: 0.4

## 2015-01-29 LAB — STD PANEL: HIV SCREEN 4TH GENERATION: NONREACTIVE

## 2015-01-31 ENCOUNTER — Telehealth: Payer: Self-pay | Admitting: Family Medicine

## 2015-01-31 NOTE — Telephone Encounter (Signed)
Call pt: CMP is ok.  Total cholesterol is ok. Triglyceride are really high.  Greater than 400  Make sure eating healthy diet and getting exercise 5 days epr week. Taking fish oil 2-3 tabs per daily will lower triglycerides as well. Recheck in 6 months  Prostate level is normal. Negative for HIV and for Hepatitis C.

## 2015-02-01 ENCOUNTER — Encounter: Payer: Self-pay | Admitting: Family Medicine

## 2015-02-01 NOTE — Telephone Encounter (Signed)
Unable to lvm will try later.Robert Ward [place]

## 2015-02-02 NOTE — Telephone Encounter (Signed)
Pt informed of results/recommendations.Robert Ward [place]

## 2016-01-16 ENCOUNTER — Encounter: Payer: Self-pay | Admitting: Family Medicine

## 2016-01-16 ENCOUNTER — Ambulatory Visit (INDEPENDENT_AMBULATORY_CARE_PROVIDER_SITE_OTHER): Payer: BLUE CROSS/BLUE SHIELD | Admitting: Family Medicine

## 2016-01-16 VITALS — BP 112/69 | HR 55 | Ht 69.0 in | Wt 250.0 lb

## 2016-01-16 DIAGNOSIS — E785 Hyperlipidemia, unspecified: Secondary | ICD-10-CM

## 2016-01-16 DIAGNOSIS — R7301 Impaired fasting glucose: Secondary | ICD-10-CM

## 2016-01-16 DIAGNOSIS — Z0000 Encounter for general adult medical examination without abnormal findings: Secondary | ICD-10-CM | POA: Diagnosis not present

## 2016-01-16 DIAGNOSIS — R197 Diarrhea, unspecified: Secondary | ICD-10-CM | POA: Diagnosis not present

## 2016-01-16 LAB — POCT GLYCOSYLATED HEMOGLOBIN (HGB A1C): Hemoglobin A1C: 6.2

## 2016-01-16 NOTE — Progress Notes (Signed)
Subjective:    CC: CPE  HPI:  55 year old male comes in today for complete physical exam. 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 He has no allergies to foods or medication. His only prescription medication is a cholesterol medicine. He is due for repeat blood work today.  He does complain of having diarrhea particularly after he eats eggs. He says sometimes he can eat and it doesn't bother him at other times he says he can't leave the house because he gets severe diarrhea. He thinks he may be developing an allergy but is not sure.   Past medical history, Surgical history, Family history not pertinant except as noted below, Social history, Allergies, and medications have been entered into the medical record, reviewed, and corrections made.   Review of Systems: No fevers, chills, night sweats, weight loss, chest pain, or shortness of breath.   Objective:    General: Well Developed, well nourished, and in no acute distress.  Neuro: Alert and oriented x3, extra-ocular muscles intact, sensation grossly intact.  HEENT: Normocephalic, atraumatic  Skin: Warm and dry, no rashes. Cardiac: Regular rate and rhythm, no murmurs rubs or gallops, no lower extremity edema.  Respiratory: Clear to auscultation bilaterally. Not using accessory muscles, speaking in full sentences.   Impression and Recommendations:   CPE -  Keep up a regular exercise program and make sure you are eating a healthy diet Try to eat 4 servings of dairy a day, or if you are lactose intolerant take a calcium with vitamin D daily.  Your vaccines are up to date.    IFG - improved. Hemoglobin A1c down to 6.2 from previous of 6.3. Continue to monitor yearly. We did discuss the option of starting metformin early since he is in the prediabetic range. This will help reduce his chances of becoming overtly diabetic over the next coming years. He will think about it. Continue to work on diet and  exercise.  Obesity/BMI 36 - he has lost 6 pounds since I last saw him. He says he's been eating a little bit more healthy overall it's all family has changed her diet. Continue current regimen continue work on weight loss.  Diarrhea-food allergy panel. It should check for egg yolk and egg white allergy.

## 2016-01-16 NOTE — Patient Instructions (Signed)
Keep up a regular exercise program and make sure you are eating a healthy diet Try to eat 4 servings of dairy a day, or if you are lactose intolerant take a calcium with vitamin D daily.  Your vaccines are up to date.   

## 2016-01-18 ENCOUNTER — Encounter: Payer: Self-pay | Admitting: Family Medicine

## 2016-01-18 LAB — LIPID PANEL
Cholesterol: 223 mg/dL — AB (ref 0–200)
HDL: 26 mg/dL — AB (ref 35–70)
LDL CALC: 71 mg/dL
TRIGLYCERIDES: 355 mg/dL — AB (ref 40–160)

## 2016-01-18 LAB — HEPATIC FUNCTION PANEL
ALK PHOS: 63 U/L (ref 25–125)
ALT: 37 U/L (ref 10–40)
AST: 25 U/L (ref 14–40)
BILIRUBIN, TOTAL: 0.4 mg/dL

## 2016-01-18 LAB — BASIC METABOLIC PANEL
BUN: 19 mg/dL (ref 4–21)
Creatinine: 0.9 mg/dL (ref 0.6–1.3)
Glucose: 124 mg/dL
Potassium: 4.5 mmol/L (ref 3.4–5.3)
SODIUM: 140 mmol/L (ref 137–147)

## 2016-01-18 LAB — PSA: PSA: 0.4

## 2016-01-20 ENCOUNTER — Telehealth: Payer: Self-pay | Admitting: Family Medicine

## 2016-01-20 NOTE — Telephone Encounter (Signed)
Call pt: Metabolic panel including liver and kidney function looks good overall except for the blood sugar is elevated at 124. This is not surprising since his A1c that we checked that day was also 6.2 which is in the prediabetic range. Just encourage you to continue to work on healthy diet and avoiding concentrated sweets, watching portion sizes on carbohydrate intake, and getting regular exercise to keep this under control. I do recommend that we recheck her A1c in about 6 months.  Total cholesterol was 223, LDL was 226, triglycerides were 09/14/1952. All of these are elevated. In addition the good cholesterol, the HDL, was low. Again just working on healthy diet and regular exercise to help control this.   Prostate test was normal.

## 2016-01-20 NOTE — Telephone Encounter (Signed)
Pt informed of results and recommendations.Robert Ward  

## 2016-01-24 ENCOUNTER — Telehealth: Payer: Self-pay | Admitting: Family Medicine

## 2016-01-24 NOTE — Telephone Encounter (Signed)
Call pt: food allergy panel for IgE is negative. We can test for food panel IgG as well. Sometimes it will pick up on some sensitivities.

## 2016-01-25 NOTE — Telephone Encounter (Signed)
Patient advised of results and recommendations. He does not want to have the additional testing.

## 2016-02-23 ENCOUNTER — Encounter: Payer: Self-pay | Admitting: Family Medicine

## 2016-12-07 ENCOUNTER — Ambulatory Visit (INDEPENDENT_AMBULATORY_CARE_PROVIDER_SITE_OTHER): Payer: BLUE CROSS/BLUE SHIELD | Admitting: Family Medicine

## 2016-12-07 ENCOUNTER — Encounter: Payer: Self-pay | Admitting: Family Medicine

## 2016-12-07 VITALS — BP 111/58 | HR 69 | Ht 68.21 in | Wt 250.0 lb

## 2016-12-07 DIAGNOSIS — R7301 Impaired fasting glucose: Secondary | ICD-10-CM

## 2016-12-07 DIAGNOSIS — Z0000 Encounter for general adult medical examination without abnormal findings: Secondary | ICD-10-CM | POA: Diagnosis not present

## 2016-12-07 LAB — POCT GLYCOSYLATED HEMOGLOBIN (HGB A1C): Hemoglobin A1C: 6.5

## 2016-12-07 NOTE — Patient Instructions (Addendum)
Keep up a regular exercise program and make sure you are eating a healthy diet Try to eat 4 servings of dairy a day, or if you are lactose intolerant take a calcium with vitamin D daily.  Your vaccines are up to date.    Diabetes Mellitus and Food It is important for you to manage your blood sugar (glucose) level. Your blood glucose level can be greatly affected by what you eat. Eating healthier foods in the appropriate amounts throughout the day at about the same time each day will help you control your blood glucose level. It can also help slow or prevent worsening of your diabetes mellitus. Healthy eating may even help you improve the level of your blood pressure and reach or maintain a healthy weight. General recommendations for healthful eating and cooking habits include:  Eating meals and snacks regularly. Avoid going long periods of time without eating to lose weight.  Eating a diet that consists mainly of plant-based foods, such as fruits, vegetables, nuts, legumes, and whole grains.  Using low-heat cooking methods, such as baking, instead of high-heat cooking methods, such as deep frying.  Work with your dietitian to make sure you understand how to use the Nutrition Facts information on food labels. How can food affect me? Carbohydrates Carbohydrates affect your blood glucose level more than any other type of food. Your dietitian will help you determine how many carbohydrates to eat at each meal and teach you how to count carbohydrates. Counting carbohydrates is important to keep your blood glucose at a healthy level, especially if you are using insulin or taking certain medicines for diabetes mellitus. Alcohol Alcohol can cause sudden decreases in blood glucose (hypoglycemia), especially if you use insulin or take certain medicines for diabetes mellitus. Hypoglycemia can be a life-threatening condition. Symptoms of hypoglycemia (sleepiness, dizziness, and disorientation) are similar to  symptoms of having too much alcohol. If your health care provider has given you approval to drink alcohol, do so in moderation and use the following guidelines:  Women should not have more than one drink per day, and men should not have more than two drinks per day. One drink is equal to: ? 12 oz of beer. ? 5 oz of wine. ? 1 oz of hard liquor.  Do not drink on an empty stomach.  Keep yourself hydrated. Have water, diet soda, or unsweetened iced tea.  Regular soda, juice, and other mixers might contain a lot of carbohydrates and should be counted.  What foods are not recommended? As you make food choices, it is important to remember that all foods are not the same. Some foods have fewer nutrients per serving than other foods, even though they might have the same number of calories or carbohydrates. It is difficult to get your body what it needs when you eat foods with fewer nutrients. Examples of foods that you should avoid that are high in calories and carbohydrates but low in nutrients include:  Trans fats (most processed foods list trans fats on the Nutrition Facts label).  Regular soda.  Juice.  Candy.  Sweets, such as cake, pie, doughnuts, and cookies.  Fried foods.  What foods can I eat? Eat nutrient-rich foods, which will nourish your body and keep you healthy. The food you should eat also will depend on several factors, including:  The calories you need.  The medicines you take.  Your weight.  Your blood glucose level.  Your blood pressure level.  Your cholesterol level.  You should  eat a variety of foods, including:  Protein. ? Lean cuts of meat. ? Proteins low in saturated fats, such as fish, egg whites, and beans. Avoid processed meats.  Fruits and vegetables. ? Fruits and vegetables that may help control blood glucose levels, such as apples, mangoes, and yams.  Dairy products. ? Choose fat-free or low-fat dairy products, such as milk, yogurt, and  cheese.  Grains, bread, pasta, and rice. ? Choose whole grain products, such as multigrain bread, whole oats, and brown rice. These foods may help control blood pressure.  Fats. ? Foods containing healthful fats, such as nuts, avocado, olive oil, canola oil, and fish.  Does everyone with diabetes mellitus have the same meal plan? Because every person with diabetes mellitus is different, there is not one meal plan that works for everyone. It is very important that you meet with a dietitian who will help you create a meal plan that is just right for you. This information is not intended to replace advice given to you by your health care provider. Make sure you discuss any questions you have with your health care provider. Document Released: 03/22/2005 Document Revised: 12/01/2015 Document Reviewed: 05/22/2013 Elsevier Interactive Patient Education  2017 [employer]

## 2016-12-07 NOTE — Progress Notes (Signed)
Subjective:    Patient ID: Robert Ward, male    DOB: [birth year], 56 y.o.   MRN: [id number]  HPI 56 year old male is here today for complete physical exam. He does have a history of impaired fasting glucose and hyperlipidemia. He also needs a form completed for [employer] Particip[employer].  He does not dissipate in a regular exercise routine but has been walking his dog lately.   Review of Systems Comprehensive review of systems is negative.  BP (!) 111/58   Pulse 69   Ht 5' 8.21" (1.733 m)   Wt 250 lb (113.4 kg)   SpO2 98%   BMI 37.78 kg/m     No Known Allergies  No past medical history on file.  Past Surgical History:  Procedure Laterality Date  . TONSILLECTOMY  5    Social History   Social History  . Marital status: Married    Spouse name: N/A  . Number of children: 1   . Years of education: N/A   Occupational History  . Engineer     [employer]   Social History Main Topics  . Smoking status: Never Smoker  . Smokeless tobacco: Never Used  . Alcohol use Yes  . Drug use: No  . 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   Other Topics Concern  . Not on file   Social History Narrative   1 caffeine drink per day. Some regular exercise.      Family History  Problem Relation Age of Onset  . COPD Mother        + smoker  . COPD Father        + smoker, died 6265  . Heart attack Maternal Grandmother 60    Outpatient Encounter Prescriptions as of 12/07/2016  Medication Sig  . GARLIC PO Take by mouth.  . MULTIPLE VITAMIN PO Take by mouth.  . Omega-3 Fatty Acids (FISH OIL PO) Take by mouth.   No facility-administered encounter medications on file as of 12/07/2016.          Objective:   Physical Exam  Constitutional: He is oriented to person, place, and time. He appears well-developed and well-nourished.  HENT:  Head: Normocephalic and atraumatic.  Right Ear:  External ear normal.  Left Ear: External ear normal.  Nose: Nose normal.  Mouth/Throat: Oropharynx is clear and moist.  Eyes: Conjunctivae and EOM are normal. Pupils are equal, round, and reactive to light.  Neck: Normal range of motion. Neck supple. No thyromegaly present.  Cardiovascular: Normal rate, regular rhythm, normal heart sounds and intact distal pulses.   Pulmonary/Chest: Effort normal and breath sounds normal.  Abdominal: Soft. Bowel sounds are normal. He exhibits no distension and no mass. There is no tenderness. There is no rebound and no guarding.  Musculoskeletal: Normal range of motion.  Lymphadenopathy:    He has no cervical adenopathy.  Neurological: He is alert and oriented to person, place, and time. He has normal reflexes.  Skin: Skin is warm and dry.  Psychiatric: He has a normal mood and affect. His behavior is normal. Judgment and thought content normal.          Assessment & Plan:  CPE Keep up a regular exercise program and make sure you are eating a healthy diet Try to eat 4 servings of dairy a day, or if you are lactose intolerant take a calcium  with vitamin D daily.  Your vaccines are up to date.  Lab slip provided. Form completed for [employer] participation.    Impaired Fasting glucose-hemoglobin A1c 6.5 today. We discussed options including starting metformin or really working on diet and exercise over the next 3 months to try to get this back down into the impaired fasting range. He wants to hold off on the metformin and really try to work on diet and exercise first. He is not currently exercising that he has been walking his old some. He has not been watching his diet. He says his wife is diabetic.

## 2016-12-21 ENCOUNTER — Other Ambulatory Visit: Payer: Self-pay | Admitting: Family Medicine

## 2016-12-21 DIAGNOSIS — Z0000 Encounter for general adult medical examination without abnormal findings: Secondary | ICD-10-CM | POA: Diagnosis not present

## 2016-12-22 LAB — COMPREHENSIVE METABOLIC PANEL
ALT: 35 IU/L (ref 0–44)
AST: 21 IU/L (ref 0–40)
Albumin/Globulin Ratio: 1.9 (ref 1.2–2.2)
Albumin: 4.4 g/dL (ref 3.5–5.5)
Alkaline Phosphatase: 59 IU/L (ref 39–117)
BILIRUBIN TOTAL: 0.3 mg/dL (ref 0.0–1.2)
BUN/Creatinine Ratio: 23 — ABNORMAL HIGH (ref 9–20)
BUN: 18 mg/dL (ref 6–24)
CALCIUM: 9.5 mg/dL (ref 8.7–10.2)
CHLORIDE: 102 mmol/L (ref 96–106)
CO2: 24 mmol/L (ref 20–29)
Creatinine, Ser: 0.78 mg/dL (ref 0.76–1.27)
GFR, EST AFRICAN AMERICAN: 118 mL/min/{1.73_m2} (ref >59)
GFR, EST NON AFRICAN AMERICAN: 102 mL/min/{1.73_m2} (ref >59)
GLUCOSE: 118 mg/dL — AB (ref 65–99)
Globulin, Total: 2.3 g/dL (ref 1.5–4.5)
Potassium: 4.6 mmol/L (ref 3.5–5.2)
Sodium: 140 mmol/L (ref 134–144)
TOTAL PROTEIN: 6.7 g/dL (ref 6.0–8.5)

## 2016-12-22 LAB — PSA, TOTAL AND FREE
PSA FREE: 0.16 ng/mL
PSA, Free Pct: 40 %
Prostate Specific Ag, Serum: 0.4 ng/mL (ref 0.0–4.0)

## 2016-12-22 LAB — LIPID PANEL W/O CHOL/HDL RATIO
Cholesterol, Total: 201 mg/dL — ABNORMAL HIGH (ref 100–199)
HDL: 26 mg/dL — AB (ref >39)
LDL Calculated: 117 mg/dL — ABNORMAL HIGH (ref 0–99)
TRIGLYCERIDES: 291 mg/dL — AB (ref 0–149)
VLDL CHOLESTEROL CAL: 58 mg/dL — AB (ref 5–40)

## 2016-12-22 LAB — AMBIG ABBREV CMP14 DEFAULT

## 2016-12-22 LAB — AMBIG ABBREV LP DEFAULT

## 2016-12-31 ENCOUNTER — Encounter: Payer: Self-pay | Admitting: Emergency Medicine

## 2016-12-31 ENCOUNTER — Emergency Department
Admission: EM | Admit: 2016-12-31 | Discharge: 2016-12-31 | Disposition: A | Discharge status: Home / Self Care | Payer: BLUE CROSS/BLUE SHIELD | Source: Home / Self Care | Attending: Family Medicine | Admitting: Family Medicine

## 2016-12-31 DIAGNOSIS — B309 Viral conjunctivitis, unspecified: Secondary | ICD-10-CM

## 2016-12-31 MED ORDER — CARBOXYMETHYLCELLULOSE SODIUM 1 % OP SOLN: 1.0000 [drp] | Freq: Three times a day (TID) | OPHTHALMIC | 0 refills | Status: DC

## 2016-12-31 NOTE — ED Triage Notes (Signed)
Rt eye red, watering, matted this morning, started last night. Denies pain, itching or burning.

## 2016-12-31 NOTE — ED Provider Notes (Signed)
CSN: [id number]     Arrival date & time 12/31/16  4098 History   First MD Initiated Contact with Patient 12/31/16 [phone]     Chief Complaint  Patient presents with  . Eye Problem   (Consider location/radiation/quality/duration/timing/severity/associated sxs/prior Treatment) HPI  Robert Ward is a 56 y.o. male presenting to UC with c/o Right eye irritation, redness, and small amount of crusty discharge that started last night. He has not tried anything for his symptoms. He does wear contacts but notes he has not worn them recently.  Denies recent illness of cough, congestion, ear pain or sore throat. Denies fever. Denies pain in his eye or change in vision.   History reviewed. No pertinent past medical history. Past Surgical History:  Procedure Laterality Date  . TONSILLECTOMY  5   Family History  Problem Relation Age of Onset  . COPD Mother        + smoker  . COPD Father        + smoker, died 63  . Heart attack Maternal Grandmother 32   Social History  Substance Use Topics  . Smoking status: Never Smoker  . Smokeless tobacco: Never Used  . Alcohol use Yes    Review of Systems  HENT: Negative for congestion, ear pain and rhinorrhea.   Eyes: Positive for discharge and redness. Negative for photophobia, pain, itching and visual disturbance.  Neurological: Negative for dizziness, light-headedness and headaches.    Allergies  Patient has no known allergies.  Home Medications   Prior to Admission medications   Medication Sig Start Date End Date Taking? Authorizing Provider  carboxymethylcellulose 1 % ophthalmic solution Apply 1 drop to eye 3 (three) times daily. 12/31/16   Lurene Shadow, PA-C  GARLIC PO Take by mouth.    [provider]  MULTIPLE VITAMIN PO Take by mouth.    [provider]  Omega-3 Fatty Acids (FISH OIL PO) Take by mouth.    [provider]   Meds Ordered and Administered this Visit  Medications - No data to display  BP  130/80 (BP Location: Left Arm)   Pulse 63   Temp 98.7 F (37.1 C) (Oral)   Ht 5\' 8"  (1.727 m)   Wt 254 lb (115.2 kg)   SpO2 96%   BMI 38.62 kg/m  No data found.   Physical Exam  Constitutional: He is oriented to person, place, and time. He appears well-developed and well-nourished. No distress.  HENT:  Head: Normocephalic and atraumatic.  Right Ear: Tympanic membrane normal.  Left Ear: Tympanic membrane normal.  Nose: Nose normal.  Mouth/Throat: Uvula is midline, oropharynx is clear and moist and mucous membranes are normal.  Eyes: EOM and lids are normal. Lids are everted and swept, no foreign bodies found. Right eye exhibits discharge (scant crusty). Right eye exhibits no chemosis, no exudate and no hordeolum. No foreign body present in the right eye. Right conjunctiva is injected.  Neck: Normal range of motion.  Cardiovascular: Normal rate.   Pulmonary/Chest: Effort normal.  Musculoskeletal: Normal range of motion.  Neurological: He is alert and oriented to person, place, and time.  Skin: Skin is warm and dry. He is not diaphoretic.  Psychiatric: He has a normal mood and affect. His behavior is normal.  Nursing note and vitals reviewed.   Urgent Care Course     Procedures (including critical care time)  Labs Review Labs Reviewed - No data to display  Imaging Review No results found.   Visual  Acuity Review  Right Eye Distance: 20/20 Left Eye Distance: 20/20 Bilateral Distance: 20/20 (with correction)    MDM   1. Acute viral conjunctivitis of right eye    Exam not c/w bacterial infection. Will encouraged symptomatic treatment.  Rx: Refresh eye drops  Encouraged f/u in 1 week if not improving, sooner if worsening.    Lurene Shadowhelps, Toccara Alford O, [place] 12/31/16 [phone]

## 2017-12-06 ENCOUNTER — Encounter: Payer: Self-pay | Admitting: Family Medicine

## 2017-12-06 ENCOUNTER — Telehealth: Payer: Self-pay | Admitting: Family Medicine

## 2017-12-06 ENCOUNTER — Ambulatory Visit (INDEPENDENT_AMBULATORY_CARE_PROVIDER_SITE_OTHER): Payer: BLUE CROSS/BLUE SHIELD | Admitting: Family Medicine

## 2017-12-06 VITALS — BP 138/63 | HR 73 | Ht 68.0 in | Wt 258.0 lb

## 2017-12-06 DIAGNOSIS — Z0000 Encounter for general adult medical examination without abnormal findings: Secondary | ICD-10-CM | POA: Diagnosis not present

## 2017-12-06 DIAGNOSIS — Z0184 Encounter for antibody response examination: Secondary | ICD-10-CM

## 2017-12-06 DIAGNOSIS — E119 Type 2 diabetes mellitus without complications: Secondary | ICD-10-CM | POA: Diagnosis not present

## 2017-12-06 DIAGNOSIS — R0683 Snoring: Secondary | ICD-10-CM | POA: Diagnosis not present

## 2017-12-06 LAB — POCT GLYCOSYLATED HEMOGLOBIN (HGB A1C): Hemoglobin A1C: 6.9 % — AB (ref 4.0–5.6)

## 2017-12-06 MED ORDER — METFORMIN HCL ER 500 MG PO TB24: 500.0000 mg | ORAL_TABLET | Freq: Every day | ORAL | 0 refills | Status: DC

## 2017-12-06 MED ORDER — AMBULATORY NON FORMULARY MEDICATION: Status: DC

## 2017-12-06 NOTE — Telephone Encounter (Signed)
LM on Vm to return call. Call back information given. KG LPN

## 2017-12-06 NOTE — Progress Notes (Signed)
Subjective:    Patient ID: Robert Ward, male    DOB: [birth year], 57 y.o.   MRN: [id number]  HPI 57 year old male comes in today for complete physical exam.  Impaired fasting glucose-no increased thirst or urination. No symptoms consistent with hypoglycemia.  He is not currently exercising.  Unfortunately his hemorrhoids have been flaring and this is kept him from exercising.  He is at the point where he would like referral back to general surgery.  He had a hemorrhoidectomy about 4 5 years ago in [place] but says he would prefer to go to no [facility] in [place].  He also wanted to let me know that he is traveling to [place] for work and did get some vaccines updated.  He brought in his yellow card today.  He was also given a prescription for azithromycin and for malaria prevention.  He does report snoring for years.  Happens in all positions.     Review of Systems  Comprehensive review of systems is negative except for HPI.  BP 138/63   Pulse 73   Ht 5' 8" (1.727 m)   Wt 258 lb (117 kg)   SpO2 99%   BMI 39.23 kg/m     No Known Allergies  No past medical history on file.  Past Surgical History:  Procedure Laterality Date  . TONSILLECTOMY  5    Social History   Socioeconomic History  . Marital status: Married    Spouse name: Not on file  . Number of children: 1   . Years of education: Not on file  . Highest education level: Not on file  Occupational History  . Occupation: Chief Financial Officer    Comment: [employer]  Social Needs  . Financial resource strain: Not on file  . Food insecurity:    Worry: Not on file    Inability: Not on file  . Transportation needs:    Medical: Not on file    Non-medical: Not on file  Tobacco Use  . Smoking status: Never Smoker  . Smokeless tobacco: Never Used  Substance and Sexual Activity  . Alcohol use: Yes  . Drug use: No  . Sexual activity: Yes    Partners: Female    Comment: works as a Programmer, multimedia, not married, 1 child,  doesn't regularly exercise.  Lifestyle  . Physical activity:    Days per week: Not on file    Minutes per session: Not on file  . Stress: Not on file  Relationships  . Social connections:    Talks on phone: Not on file    Gets together: Not on file    Attends religious service: Not on file    Active member of club or organization: Not on file    Attends meetings of clubs or organizations: Not on file    Relationship status: Not on file  . Intimate partner violence:    Fear of current or ex partner: Not on file    Emotionally abused: Not on file    Physically abused: Not on file    Forced sexual activity: Not on file  Other Topics Concern  . Not on file  Social History Narrative   1 caffeine drink per day. Some regular exercise.      Family History  Problem Relation Age of Onset  . COPD Mother        + smoker  . COPD Father        + smoker, died 61  . Heart attack  Maternal Grandmother 70    Outpatient Encounter Medications as of 12/06/2017  Medication Sig  . AMBULATORY NON FORMULARY MEDICATION Medication Name: glucometer with lancets and strips to test once a day. Dx. Diabetes type 2.  12/06/17  . carboxymethylcellulose 1 % ophthalmic solution Apply 1 drop to eye 3 (three) times daily.  Marland Kitchen GARLIC PO Take by mouth.  . metFORMIN (GLUCOPHAGE-XR) 500 MG 24 hr tablet Take 1 tablet (500 mg total) by mouth daily with breakfast.  . MULTIPLE VITAMIN PO Take by mouth.  . Omega-3 Fatty Acids (FISH OIL PO) Take by mouth.   No facility-administered encounter medications on file as of 12/06/2017.           Objective:   Physical Exam  Constitutional: He is oriented to person, place, and time. He appears well-developed and well-nourished.  HENT:  Head: Normocephalic and atraumatic.  Right Ear: External ear normal.  Left Ear: External ear normal.  Nose: Nose normal.  Mouth/Throat: Oropharynx is clear and moist.  Eyes: Pupils are equal, round, and reactive to light. Conjunctivae  and EOM are normal.  Neck: Normal range of motion. Neck supple. No thyromegaly present.  Cardiovascular: Normal rate, regular rhythm, normal heart sounds and intact distal pulses.  Pulmonary/Chest: Effort normal and breath sounds normal.  Abdominal: Soft. Bowel sounds are normal. He exhibits no distension and no mass. There is no tenderness. There is no rebound and no guarding.  Musculoskeletal: Normal range of motion.  Lymphadenopathy:    He has no cervical adenopathy.  Neurological: He is alert and oriented to person, place, and time. He has normal reflexes.  Skin: Skin is warm and dry.  Psychiatric: He has a normal mood and affect. His behavior is normal. Judgment and thought content normal.        Assessment & Plan:  CPE Keep up a regular exercise program and make sure you are eating a healthy diet Try to eat 4 servings of dairy a day, or if you are lactose intolerant take a calcium with vitamin D daily.  Your vaccines are up to date.  Due for CMP and lipid panel as well as screening PSA.  Diabetes-new diagnosis today.  Hemoglobin A1c elevated at 6.9.  We discussed low sugar low-carb diet.  I would like him to could do some nutrition counseling.  He says he actually thinks he may be able to do this for free through work and so will check into that.  We will give him prescription for his own glucometer and start him on metformin.  Discussed potential side effects the medication.  I will see him back in 90 days. Reminded to schedule an eye exam.   Snoring -stop bang questionnaire score of 6 which is significant and makes him high risk for possible diagnosis of sleep apnea.  I do think we should get him further tested.  H.O.  provided for shingles vaccine.  Also like to have immunity status testing for MMR.

## 2017-12-06 NOTE — Patient Instructions (Signed)
Diabetes Mellitus and Nutrition When you have diabetes (diabetes mellitus), it is very important to have healthy eating habits because your blood sugar (glucose) levels are greatly affected by what you eat and drink. Eating healthy foods in the appropriate amounts, at about the same times every day, can help you:  Control your blood glucose.  Lower your risk of heart disease.  Improve your blood pressure.  Reach or maintain a healthy weight.  Every person with diabetes is different, and each person has different needs for a meal plan. Your health care provider may recommend that you work with a diet and nutrition specialist (dietitian) to make a meal plan that is best for you. Your meal plan may vary depending on factors such as:  The calories you need.  The medicines you take.  Your weight.  Your blood glucose, blood pressure, and cholesterol levels.  Your activity level.  Other health conditions you have, such as heart or kidney disease.  How do carbohydrates affect me? Carbohydrates affect your blood glucose level more than any other type of food. Eating carbohydrates naturally increases the amount of glucose in your blood. Carbohydrate counting is a method for keeping track of how many carbohydrates you eat. Counting carbohydrates is important to keep your blood glucose at a healthy level, especially if you use insulin or take certain oral diabetes medicines. It is important to know how many carbohydrates you can safely have in each meal. This is different for every person. Your dietitian can help you calculate how many carbohydrates you should have at each meal and for snack. Foods that contain carbohydrates include:  Bread, cereal, rice, pasta, and crackers.  Potatoes and corn.  Peas, beans, and lentils.  Milk and yogurt.  Fruit and juice.  Desserts, such as cakes, cookies, ice cream, and candy.  How does alcohol affect me? Alcohol can cause a sudden decrease in blood  glucose (hypoglycemia), especially if you use insulin or take certain oral diabetes medicines. Hypoglycemia can be a life-threatening condition. Symptoms of hypoglycemia (sleepiness, dizziness, and confusion) are similar to symptoms of having too much alcohol. If your health care provider says that alcohol is safe for you, follow these guidelines:  Limit alcohol intake to no more than 1 drink per day for nonpregnant women and 2 drinks per day for men. One drink equals 12 oz of beer, 5 oz of wine, or 1 oz of hard liquor.  Do not drink on an empty stomach.  Keep yourself hydrated with water, diet soda, or unsweetened iced tea.  Keep in mind that regular soda, juice, and other mixers may contain a lot of sugar and must be counted as carbohydrates.  What are tips for following this plan? Reading food labels  Start by checking the serving size on the label. The amount of calories, carbohydrates, fats, and other nutrients listed on the label are based on one serving of the food. Many foods contain more than one serving per package.  Check the total grams (g) of carbohydrates in one serving. You can calculate the number of servings of carbohydrates in one serving by dividing the total carbohydrates by 15. For example, if a food has 30 g of total carbohydrates, it would be equal to 2 servings of carbohydrates.  Check the number of grams (g) of saturated and trans fats in one serving. Choose foods that have low or no amount of these fats.  Check the number of milligrams (mg) of sodium in one serving. Most people   should limit total sodium intake to less than 2,300 mg per day.  Always check the nutrition information of foods labeled as "low-fat" or "nonfat". These foods may be higher in added sugar or refined carbohydrates and should be avoided.  Talk to your dietitian to identify your daily goals for nutrients listed on the label. Shopping  Avoid buying canned, premade, or processed foods. These  foods tend to be high in fat, sodium, and added sugar.  Shop around the outside edge of the grocery store. This includes fresh fruits and vegetables, bulk grains, fresh meats, and fresh dairy. Cooking  Use low-heat cooking methods, such as baking, instead of high-heat cooking methods like deep frying.  Cook using healthy oils, such as olive, canola, or sunflower oil.  Avoid cooking with butter, cream, or high-fat meats. Meal planning  Eat meals and snacks regularly, preferably at the same times every day. Avoid going long periods of time without eating.  Eat foods high in fiber, such as fresh fruits, vegetables, beans, and whole grains. Talk to your dietitian about how many servings of carbohydrates you can eat at each meal.  Eat 4-6 ounces of lean protein each day, such as lean meat, chicken, fish, eggs, or tofu. 1 ounce is equal to 1 ounce of meat, chicken, or fish, 1 egg, or 1/4 cup of tofu.  Eat some foods each day that contain healthy fats, such as avocado, nuts, seeds, and fish. Lifestyle   Check your blood glucose regularly.  Exercise at least 30 minutes 5 or more days each week, or as told by your health care provider.  Take medicines as told by your health care provider.  Do not use any products that contain nicotine or tobacco, such as cigarettes and e-cigarettes. If you need help quitting, ask your health care provider.  Work with a counselor or diabetes educator to identify strategies to manage stress and any emotional and social challenges. What are some questions to ask my health care provider?  Do I need to meet with a diabetes educator?  Do I need to meet with a dietitian?  What number can I call if I have questions?  When are the best times to check my blood glucose? Where to find more information:  American Diabetes Association: diabetes.org/food-and-fitness/food  Academy of Nutrition and Dietetics:  www.eatright.org/resources/health/diseases-and-conditions/diabetes  National Institute of Diabetes and Digestive and Kidney Diseases (NIH): www.niddk.nih.gov/health-information/diabetes/overview/diet-eating-physical-activity Summary  A healthy meal plan will help you control your blood glucose and maintain a healthy lifestyle.  Working with a diet and nutrition specialist (dietitian) can help you make a meal plan that is best for you.  Keep in mind that carbohydrates and alcohol have immediate effects on your blood glucose levels. It is important to count carbohydrates and to use alcohol carefully. This information is not intended to replace advice given to you by your health care provider. Make sure you discuss any questions you have with your health care provider. Document Released: 03/22/2005 Document Revised: 07/30/2016 Document Reviewed: 07/30/2016 Elsevier Interactive Patient Education  2018 Elsevier Inc.  

## 2017-12-06 NOTE — Telephone Encounter (Signed)
Snoring -stop bang questionnaire score of 6 which is significant and makes him high risk for possible diagnosis of sleep apnea.  I do think we should get him further tested. If he is ok with this please let me know.

## 2017-12-09 NOTE — Telephone Encounter (Signed)
lvm asking pt to rtn if he is ok w/moving forward with sleep study.Heath GoldBarkley, Jeremy Ditullio Lynetta, CMA

## 2017-12-12 NOTE — Telephone Encounter (Signed)
Printed order and gave to the referral department.

## 2017-12-12 NOTE — Telephone Encounter (Signed)
Patient is aware 

## 2017-12-14 DIAGNOSIS — Z0184 Encounter for antibody response examination: Secondary | ICD-10-CM | POA: Diagnosis not present

## 2017-12-14 DIAGNOSIS — Z0000 Encounter for general adult medical examination without abnormal findings: Secondary | ICD-10-CM | POA: Diagnosis not present

## 2017-12-16 LAB — LIPID PANEL
CHOL/HDL RATIO: 8.2 ratio — AB (ref 0.0–5.0)
Cholesterol, Total: 214 mg/dL — ABNORMAL HIGH (ref 100–199)
HDL: 26 mg/dL — ABNORMAL LOW (ref >39)
LDL CALC: 134 mg/dL — AB (ref 0–99)
Triglycerides: 269 mg/dL — ABNORMAL HIGH (ref 0–149)
VLDL Cholesterol Cal: 54 mg/dL — ABNORMAL HIGH (ref 5–40)

## 2017-12-16 LAB — MEASLES/MUMPS/RUBELLA IMMUNITY: Rubella Antibodies, IGG: >33 index (ref >0.99)

## 2017-12-16 LAB — PSA: PROSTATE SPECIFIC AG, SERUM: 0.3 ng/mL (ref 0.0–4.0)

## 2018-01-23 ENCOUNTER — Encounter: Payer: Self-pay | Admitting: Family Medicine

## 2018-01-23 DIAGNOSIS — E119 Type 2 diabetes mellitus without complications: Secondary | ICD-10-CM

## 2018-01-26 ENCOUNTER — Ambulatory Visit (HOSPITAL_BASED_OUTPATIENT_CLINIC_OR_DEPARTMENT_OTHER): Payer: BLUE CROSS/BLUE SHIELD | Attending: Family Medicine | Admitting: Internal Medicine

## 2018-01-26 VITALS — Ht 68.0 in | Wt 250.0 lb

## 2018-01-26 DIAGNOSIS — R0683 Snoring: Secondary | ICD-10-CM | POA: Diagnosis not present

## 2018-01-27 MED ORDER — GLUCOSE BLOOD VI STRP: ORAL_STRIP | 12 refills | Status: DC

## 2018-01-27 MED ORDER — ACCU-CHEK AVIVA PLUS W/DEVICE KIT: PACK | 99 refills | Status: DC

## 2018-01-27 MED ORDER — ACCU-CHEK SOFT TOUCH LANCETS MISC: 12 refills | Status: DC

## 2018-02-08 DIAGNOSIS — R0683 Snoring: Secondary | ICD-10-CM

## 2018-02-18 ENCOUNTER — Telehealth: Payer: Self-pay | Admitting: Family Medicine

## 2018-02-18 DIAGNOSIS — R0683 Snoring: Secondary | ICD-10-CM | POA: Diagnosis not present

## 2018-02-18 NOTE — Procedures (Signed)
  Patient Name: Robert Ward, Robert Ward Study Date: 01/26/2018 Gender: Male D.O.B: 29-Mar-1961 Age (years): 57 Referring Provider: Nani Gasseratherine Metheney Height (inches): 68 Interpreting Physician: Jetty Duhamellinton Kane Kusek MD, ABSM Weight (lbs): 250 RPSGT: Armen PickupFord, Evelyn BMI: 38 MRN: [id number] Neck Size: 17.00  CLINICAL INFORMATION Sleep Study Type: NPSG Indication for sleep study: Snoring  Epworth Sleepiness Score: 4  SLEEP STUDY TECHNIQUE As per the AASM Manual for the Scoring of Sleep and Associated Events v2.3 (April 2016) with a hypopnea requiring 4% desaturations.  The channels recorded and monitored were frontal, central and occipital EEG, electrooculogram (EOG), submentalis EMG (chin), nasal and oral airflow, thoracic and abdominal wall motion, anterior tibialis EMG, snore microphone, electrocardiogram, and pulse oximetry.  MEDICATIONS Medications self-administered by patient taken the night of the study : none reported  SLEEP ARCHITECTURE The study was initiated at 9:58:21 PM and ended at 4:15:36 AM.  Sleep onset time was 41.0 minutes and the sleep efficiency was 68.5%%. The total sleep time was 258.5 minutes.  Stage REM latency was 188.5 minutes.  The patient spent 12.0%% of the night in stage N1 sleep, 79.3%% in stage N2 sleep, 2.1%% in stage N3 and 6.6% in REM.  Alpha intrusion was absent.  Supine sleep was 48.64%.  RESPIRATORY PARAMETERS The overall apnea/hypopnea index (AHI) was 0.2 per hour. There were 0 total apneas, including 0 obstructive, 0 central and 0 mixed apneas. There were 1 hypopneas and 69 RERAs.  The AHI during Stage REM sleep was 0.0 per hour.  AHI while supine was 0.5 per hour.  The mean oxygen saturation was 94.2%. The minimum SpO2 during sleep was 88.0%.  soft snoring was noted during this study.  CARDIAC DATA The 2 lead EKG demonstrated sinus rhythm. The mean heart rate was 56.8 beats per minute. Other EKG findings include: None.  LEG MOVEMENT DATA The  total PLMS were 0 with a resulting PLMS index of 0.0. Associated arousal with leg movement index was 5.6 .  IMPRESSIONS - No significant obstructive sleep apnea occurred during this study (AHI = 0.2/h). - No significant central sleep apnea occurred during this study (CAI = 0.0/h). - The patient had minimal or no oxygen desaturation during the study (Min O2 = 88.0%, Mean 94.2%) - The patient snored with soft snoring volume. - No cardiac abnormalities were noted during this study. - Periodic limb movements did occur during sleep, total 90, averaging 20.9/ hr. Limb movement with arousals totalled 24, averaging 5.6/ hr.   DIAGNOSIS - Periodic Limb Movement During Sleep (327.51 [G47.61 ICD-10]) -  Primary snoring  RECOMMENDATIONS - Consider a trial of Mirapex, Requip, or Sinemet for treatment of Periodic Leg Movements of Sleep, if appropriate. - Be careful with alcohol, sedatives and other CNS depressants that may worsen sleep apnea and disrupt normal sleep architecture. - Sleep hygiene should be reviewed to assess factors that may improve sleep quality. - Weight management and regular exercise should be initiated or continued if appropriate.  [Electronically signed] 02/18/2018 03:15 PM  Jetty Duhamellinton Lilianah Buffin MD, ABSM Diplomate, American Board of Sleep Medicine   NPI: [id number]                          Jetty Duhamellinton Rafferty Postlewait Diplomate, American Board of Sleep Medicine  ELECTRONICALLY SIGNED ON:  02/18/2018, 3:10 PM [facility] PH: [phone]   FX: [phone] ACCREDITED BY THE AMERICAN ACADEMY OF SLEEP MEDICINE

## 2018-02-18 NOTE — Telephone Encounter (Signed)
Call pt: lets schedule him to go over results of his sleep study.   Just need regular 20 min appt.

## 2018-02-19 NOTE — Telephone Encounter (Signed)
Left pt msg to call back and get scheduled  

## 2018-02-25 NOTE — Telephone Encounter (Signed)
Left message advising patient to schedule a follow up.

## 2018-04-04 ENCOUNTER — Ambulatory Visit (INDEPENDENT_AMBULATORY_CARE_PROVIDER_SITE_OTHER): Payer: BLUE CROSS/BLUE SHIELD | Admitting: Family Medicine

## 2018-04-04 ENCOUNTER — Encounter: Payer: Self-pay | Admitting: Family Medicine

## 2018-04-04 ENCOUNTER — Telehealth: Payer: Self-pay | Admitting: Family Medicine

## 2018-04-04 VITALS — BP 115/58 | HR 54 | Ht 68.0 in | Wt 242.0 lb

## 2018-04-04 DIAGNOSIS — G4761 Periodic limb movement disorder: Secondary | ICD-10-CM | POA: Diagnosis not present

## 2018-04-04 DIAGNOSIS — M79671 Pain in right foot: Secondary | ICD-10-CM

## 2018-04-04 DIAGNOSIS — E119 Type 2 diabetes mellitus without complications: Secondary | ICD-10-CM | POA: Diagnosis not present

## 2018-04-04 DIAGNOSIS — Z23 Encounter for immunization: Secondary | ICD-10-CM | POA: Diagnosis not present

## 2018-04-04 LAB — POCT GLYCOSYLATED HEMOGLOBIN (HGB A1C): HEMOGLOBIN A1C: 6 % — AB (ref 4.0–5.6)

## 2018-04-04 MED ORDER — METFORMIN HCL ER 500 MG PO TB24: 500.0000 mg | ORAL_TABLET | Freq: Every day | ORAL | 1 refills | Status: DC

## 2018-04-04 NOTE — Telephone Encounter (Signed)
Please call patient and let him know that I would like to check him for iron deficiency the next time he gets blood work.  It can actually worsen periodic leg movement at night if he has low iron.  I looked back and we have not actually checked a hemoglobin or iron level on him in quite some time.

## 2018-04-04 NOTE — Progress Notes (Signed)
Subjective:    Patient ID: Robert Ward, male    DOB: [birth year], 57 y.o.   MRN: [id number]  HPI Diabetes - no hypoglycemic events. No wounds or sores that are not healing well. No increased thirst or urination. Checking glucose at home. Taking medications as prescribed without any side effects.  Seven-day average is 126, 14-day average is 126, and 30-day average is 128.  Also complains of right heel pain that started more recently.  He is recently been trying to do the couch to 5K 6-week program he is up to the point where he is walking 5 minutes and then running 5 minutes.  He says the heel is tender even the day after exercise when especially when he first puts his foot on the floor.  The pain does not radiate into the arch.  No recent injury or trauma.  Is actually bought some insoles for his shoes.  He is also here to go over the sleep study results that he had done.  It was negative for sleep apnea but it was positive for periodic limb movement causing about 5.4 events per hour that were actually interfering with his sleep quality.  He did have some snoring as well.  Review of Systems  BP (!) 115/58   Pulse (!) 54   Ht '5\' 8"'  (1.727 m)   Wt 242 lb (109.8 kg)   SpO2 98%   BMI 36.80 kg/m     No Known Allergies  No past medical history on file.  Past Surgical History:  Procedure Laterality Date  . TONSILLECTOMY  5    Social History   Socioeconomic History  . Marital status: Married    Spouse name: Not on file  . Number of children: 1   . Years of education: Not on file  . Highest education level: Not on file  Occupational History  . Occupation: Chief Financial Officer    Comment: [employer]  Social Needs  . Financial resource strain: Not on file  . Food insecurity:    Worry: Not on file    Inability: Not on file  . Transportation needs:    Medical: Not on file    Non-medical: Not on file  Tobacco Use  . Smoking status: Never Smoker  . Smokeless tobacco: Never Used  Substance  and Sexual Activity  . Alcohol use: Yes  . Drug use: No  . Sexual activity: Yes    Partners: Female    Comment: works as a Programmer, multimedia, not married, 1 child, doesn't regularly exercise.  Lifestyle  . Physical activity:    Days per week: Not on file    Minutes per session: Not on file  . Stress: Not on file  Relationships  . Social connections:    Talks on phone: Not on file    Gets together: Not on file    Attends religious service: Not on file    Active member of club or organization: Not on file    Attends meetings of clubs or organizations: Not on file    Relationship status: Not on file  . Intimate partner violence:    Fear of current or ex partner: Not on file    Emotionally abused: Not on file    Physically abused: Not on file    Forced sexual activity: Not on file  Other Topics Concern  . Not on file  Social History Narrative   1 caffeine drink per day. Some regular exercise.  Family History  Problem Relation Age of Onset  . COPD Mother        + smoker  . COPD Father        + smoker, died 60  . Heart attack Maternal Grandmother 60    Outpatient Encounter Medications as of 04/04/2018  Medication Sig  . AMBULATORY NON FORMULARY MEDICATION Medication Name: glucometer with lancets and strips to test once a day. Dx. Diabetes type 2.  12/06/17 (Patient taking differently: Medication Name: glucometer with lancets and strips to test once a day. Dx. Diabetes type 2.  12/06/17 Contour next)  . GARLIC PO Take by mouth.  Marland Kitchen glucose blood (CONTOUR NEXT TEST) test strip 1 each by Other route as needed for other. Use as instructed  . metFORMIN (GLUCOPHAGE-XR) 500 MG 24 hr tablet Take 1 tablet (500 mg total) by mouth daily with breakfast.  . MULTIPLE VITAMIN PO Take by mouth.  . Omega-3 Fatty Acids (FISH OIL PO) Take by mouth.  . [DISCONTINUED] Lancets (ACCU-CHEK SOFT TOUCH) lancets Use to check blood sugar twice daily. Dx: E11.9  . [DISCONTINUED] metFORMIN  (GLUCOPHAGE-XR) 500 MG 24 hr tablet Take 1 tablet (500 mg total) by mouth daily with breakfast.  . [DISCONTINUED] Blood Glucose Monitoring Suppl (ACCU-CHEK AVIVA PLUS) w/Device KIT Use to check blood sugar twice daily. Dx: E11.9  . [DISCONTINUED] carboxymethylcellulose 1 % ophthalmic solution Apply 1 drop to eye 3 (three) times daily.  . [DISCONTINUED] glucose blood (ACCU-CHEK AVIVA PLUS) test strip Use to check blood sugar twice daily. Dx: E11.9   No facility-administered encounter medications on file as of 04/04/2018.          Objective:   Physical Exam  Constitutional: He is oriented to person, place, and time. He appears well-developed and well-nourished.  HENT:  Head: Normocephalic and atraumatic.  Cardiovascular: Normal rate, regular rhythm and normal heart sounds.  Pulmonary/Chest: Effort normal and breath sounds normal.  Musculoskeletal:  Mildly tender of the anterior heel. Though he point to mid heel as area of pain.  Ankle with NROM.    Neurological: He is alert and oriented to person, place, and time.  Skin: Skin is warm and dry.  Psychiatric: He has a normal mood and affect. His behavior is normal.        Assessment & Plan:  DM-controlled.  Blood glucoses at home look great and his A1c looks great today as well.  Continue to work on exercise he is really made some great strides.  He has done a little better with his diet but still struggling some with portion control so just encouraged him to continue to work at it.  He has lost 8 pounds since he was last here in July which is fantastic. Lab Results  Component Value Date   HGBA1C 6.0 (A) 04/04/2018   Right heel pain -as possible diagnoses including heel pad bruise/injury versus a heel spur versus plantar fasciitis.  Recommend a trial of home exercises icing and anti-inflammatory as needed.  If he is not improving over the next 3 weeks and please let me know.  He has put some insoles into his shoes which should  help.  Periodic limb movement-gust diagnosis.  We also discussed potential treatment with medication.  It may be worth trying for a month or 2 just to see if he actually feels like he is sleeping and resting better.  I gave him some additional information on ropinirole.  Let me know if he is interested in starting the  medication. Consider check for iron def as well.

## 2018-04-04 NOTE — Patient Instructions (Addendum)
If your heel is not feeling better with the stretches and icing after about 3 weeks then please let us know.  We can get an x-ray and then get you in with 1 of our sports medicine docs for further work-up.  Ropinirole tablets What is this medicine? ROPINIROLE (roe PIN i role) is used to treat the symptoms of Parkinson's disease. It helps to improve muscle control and movement difficulties. It is also used for the treatment of Restless Legs Syndrome. This medicine may be used for other purposes; ask your health care provider or pharmacist if you have questions. COMMON BRAND NAME(S): Requip What should I tell my health care provider before I take this medicine? They need to know if you have any of these conditions: -dizzy or fainting spells -heart disease -high blood pressure -kidney disease -liver disease -low blood pressure -sleeping problems -an unusual or allergic reaction to ropinirole, other medicines, foods, dyes, or preservatives -pregnant or trying to get pregnant -breast-feeding How should I use this medicine? Take this medicine by mouth with a glass of water. Follow the directions on the prescription label. You can take it with or without food. If it upsets your stomach, take it with food. Take your doses at regular intervals. Do not take your medicine more often than directed. Do not stop taking this medicine except on your doctor's advice. Stopping this medicine too quickly may cause serious side effects. Talk to your pediatrician regarding the use of this medicine in children. Special care may be needed. Overdosage: If you think you have taken too much of this medicine contact a poison control center or emergency room at once. NOTE: This medicine is only for you. Do not share this medicine with others. What if I miss a dose? If you miss a dose, take it as soon as you can. If it is almost time for your next dose, take only that dose. Do not take double or extra doses. What may  interact with this medicine? -ciprofloxacin -male hormones, like estrogens and birth control pills -medicines for depression, anxiety, or psychotic disturbances -metoclopramide -mexiletine -norfloxacin -omeprazole This list may not describe all possible interactions. Give your health care provider a list of all the medicines, herbs, non-prescription drugs, or dietary supplements you use. Also tell them if you smoke, drink alcohol, or use illegal drugs. Some items may interact with your medicine. What should I watch for while using this medicine? Visit your doctor or health care professional for regular checks on your progress. It may be several weeks or months before you feel the full effect of this medicine. You may get drowsy or dizzy. Do not drive, use machinery, or do anything that needs mental alertness until you know how this drug affects you. Do not stand or sit up quickly, especially if you are an older patient. This reduces the risk of dizzy or fainting spells. Alcohol can increase possible dizziness. Avoid alcoholic drinks. If you find that you have sudden feelings of wanting to sleep during normal activities, like cooking, watching television, or while driving or riding in a car, you should contact your health care professional. Your mouth may get dry. Chewing sugarless gum or sucking hard candy, and drinking plenty of water may help. Contact your doctor if the problem does not go away or is severe. There have been reports of increased sexual urges or other strong urges such as gambling while taking some medicines for Parkinson's disease. If you experience any of these urges while taking  this medicine, you should report it to your health care provider as soon as possible. You should check your skin often for changes to moles and new growths while taking this medicine. Call your doctor if you notice any of these changes. What side effects may I notice from receiving this medicine? Side  effects that you should report to your doctor or health care professional as soon as possible: -allergic reactions like skin rash, itching or hives, swelling of the face, lips, or tongue -changes in vision -chest pain -confusion -falling asleep during normal activities like driving -fast, irregular heartbeat -feeling faint or lightheaded, falls -hallucination, loss of contact with reality -joint or muscle pain -loss of bladder control -loss of memory -new or increased gambling urges, sexual urges, uncontrolled spending, binge or compulsive eating, or other urges -pain, tingling, numbness in the hands or feet -shortness of breath, troubled breathing, tightness in chest, or wheezing -signs and symptoms of low blood pressure like dizziness; feeling faint or lightheaded, falls; unusually weak or tired -swelling of the ankles, feet, hands -uncontrollable head, mouth, neck, arm, or leg movements -vomiting Side effects that usually do not require medical attention (report to your doctor or health care professional if they continue or are bothersome): -dizziness -drowsiness -headache -increased sweating -nausea -tremors This list may not describe all possible side effects. Call your doctor for medical advice about side effects. You may report side effects to FDA at 1-800-FDA-1088. Where should I keep my medicine? Keep out of the reach of children. Store at room temperature between 20 and 25 degrees C (68 and 77 degrees F). Protect from light and moisture. Keep container tightly closed. Throw away any unused medicine after the expiration date. NOTE: This sheet is a summary. It may not cover all possible information. If you have questions about this medicine, talk to your doctor, pharmacist, or health care provider.  2018 Elsevier/Gold Standard (2015-12-12 10:58:05)

## 2018-04-07 NOTE — Telephone Encounter (Signed)
Pt advised and agreed to recommendations.Laureen Ochs, Viann Shove, CMA

## 2018-08-08 ENCOUNTER — Encounter: Payer: Self-pay | Admitting: Family Medicine

## 2018-08-08 ENCOUNTER — Ambulatory Visit: Payer: BLUE CROSS/BLUE SHIELD | Admitting: Family Medicine

## 2018-08-08 VITALS — BP 124/55 | HR 51 | Ht 68.0 in | Wt 239.0 lb

## 2018-08-08 DIAGNOSIS — E119 Type 2 diabetes mellitus without complications: Secondary | ICD-10-CM | POA: Diagnosis not present

## 2018-08-08 DIAGNOSIS — R6889 Other general symptoms and signs: Secondary | ICD-10-CM

## 2018-08-08 DIAGNOSIS — Z6835 Body mass index (BMI) 35.0-35.9, adult: Secondary | ICD-10-CM

## 2018-08-08 DIAGNOSIS — Z23 Encounter for immunization: Secondary | ICD-10-CM | POA: Diagnosis not present

## 2018-08-08 LAB — POCT GLYCOSYLATED HEMOGLOBIN (HGB A1C): Hemoglobin A1C: 6.2 % — AB (ref 4.0–5.6)

## 2018-08-08 NOTE — Progress Notes (Addendum)
Subjective:    CC: DM  HPI:  Diabetes - no hypoglycemic events. No wounds or sores that are not healing well. No increased thirst or urination. Checking glucose at home. Taking medications as prescribed without any side effects.  He also wanted to let me know that he has been experiencing cold intolerance for couple of months.  He says his wife wanted him to mention it.  He is also struggled with some weight gain and low energy.  No hair changes though he does have a receding hairline.  BMI 36-he is not currently exercising but he actually has lost about 3 pounds since he was here in September.  Past medical history, Surgical history, Family history not pertinant except as noted below, Social history, Allergies, and medications have been entered into the medical record, reviewed, and corrections made.   Review of Systems: No fevers, chills, night sweats, weight loss, chest pain, or shortness of breath.   Objective:    General: Well Developed, well nourished, and in no acute distress.  Neuro: Alert and oriented x3, extra-ocular muscles intact, sensation grossly intact.  HEENT: Normocephalic, atraumatic  Skin: Warm and dry, no rashes. Cardiac: Regular rate and rhythm, no murmurs rubs or gallops, no lower extremity edema.  Respiratory: Clear to auscultation bilaterally. Not using accessory muscles, speaking in full sentences.   Impression and Recommendations:    DM -WEll controlled.  Hemoglobin A1c of 6.2 today which looks fantastic.  Follow-up in 4 months. Declined pneumonia vaccine.    Cold intolerance-we will check thyroid.  We have actually never checked this on him before.  No family history.  Second Shingrix vaccine administered today.  BMI 36, severe obesity-encouraged him to start an exercise routine.  Ultimate goal is 30 minutes 5 days a week but encouraged him just to start with walking.  Declines flu shot and pneumonia vaccine.

## 2018-08-08 NOTE — Addendum Note (Signed)
Addended by: Nani GasserMETHENEY, Dvora Buitron D on: 08/08/2018 05:55 PM   Modules accepted: Level of Service

## 2018-08-08 NOTE — Patient Instructions (Signed)
Diabetes Mellitus and Exercise Exercising regularly is important for your overall health, especially when you have diabetes (diabetes mellitus). Exercising is not only about losing weight. It has many other health benefits, such as increasing muscle strength and bone density and reducing body fat and stress. This leads to improved fitness, flexibility, and endurance, all of which result in better overall health. Exercise has additional benefits for people with diabetes, including:  Reducing appetite.  Helping to lower and control blood glucose.  Lowering blood pressure.  Helping to control amounts of fatty substances (lipids) in the blood, such as cholesterol and triglycerides.  Helping the body to respond better to insulin (improving insulin sensitivity).  Reducing how much insulin the body needs.  Decreasing the risk for heart disease by: ? Lowering cholesterol and triglyceride levels. ? Increasing the levels of good cholesterol. ? Lowering blood glucose levels. What is my activity plan? Your health care provider or certified diabetes educator can help you make a plan for the type and frequency of exercise (activity plan) that works for you. Make sure that you:  Do at least 150 minutes of moderate-intensity or vigorous-intensity exercise each week. This could be brisk walking, biking, or water aerobics. ? Do stretching and strength exercises, such as yoga or weightlifting, at least 2 times a week. ? Spread out your activity over at least 3 days of the week.  Get some form of physical activity every day. ? Do not go more than 2 days in a row without some kind of physical activity. ? Avoid being inactive for more than 30 minutes at a time. Take frequent breaks to walk or stretch.  Choose a type of exercise or activity that you enjoy, and set realistic goals.  Start slowly, and gradually increase the intensity of your exercise over time. What do I need to know about managing my  diabetes?   Check your blood glucose before and after exercising. ? If your blood glucose is 240 mg/dL (13.3 mmol/L) or higher before you exercise, check your urine for ketones. If you have ketones in your urine, do not exercise until your blood glucose returns to normal. ? If your blood glucose is 100 mg/dL (5.6 mmol/L) or lower, eat a snack containing 15-20 grams of carbohydrate. Check your blood glucose 15 minutes after the snack to make sure that your level is above 100 mg/dL (5.6 mmol/L) before you start your exercise.  Know the symptoms of low blood glucose (hypoglycemia) and how to treat it. Your risk for hypoglycemia increases during and after exercise. Common symptoms of hypoglycemia can include: ? Hunger. ? Anxiety. ? Sweating and feeling clammy. ? Confusion. ? Dizziness or feeling light-headed. ? Increased heart rate or palpitations. ? Blurry vision. ? Tingling or numbness around the mouth, lips, or tongue. ? Tremors or shakes. ? Irritability.  Keep a rapid-acting carbohydrate snack available before, during, and after exercise to help prevent or treat hypoglycemia.  Avoid injecting insulin into areas of the body that are going to be exercised. For example, avoid injecting insulin into: ? The arms, when playing tennis. ? The legs, when jogging.  Keep records of your exercise habits. Doing this can help you and your health care provider adjust your diabetes management plan as needed. Write down: ? Food that you eat before and after you exercise. ? Blood glucose levels before and after you exercise. ? The type and amount of exercise you have done. ? When your insulin is expected to peak, if you use   insulin. Avoid exercising at times when your insulin is peaking.  When you start a new exercise or activity, work with your health care provider to make sure the activity is safe for you, and to adjust your insulin, medicines, or food intake as needed.  Drink plenty of water while  you exercise to prevent dehydration or heat stroke. Drink enough fluid to keep your urine clear or pale yellow. Summary  Exercising regularly is important for your overall health, especially when you have diabetes (diabetes mellitus).  Exercising has many health benefits, such as increasing muscle strength and bone density and reducing body fat and stress.  Your health care provider or certified diabetes educator can help you make a plan for the type and frequency of exercise (activity plan) that works for you.  When you start a new exercise or activity, work with your health care provider to make sure the activity is safe for you, and to adjust your insulin, medicines, or food intake as needed. This information is not intended to replace advice given to you by your health care provider. Make sure you discuss any questions you have with your health care provider. Document Released: 09/15/2003 Document Revised: 01/03/2017 Document Reviewed: 12/05/2015 Elsevier Interactive Patient Education  2019 Elsevier Inc.  

## 2018-08-13 DIAGNOSIS — R6889 Other general symptoms and signs: Secondary | ICD-10-CM | POA: Diagnosis not present

## 2018-08-13 DIAGNOSIS — E119 Type 2 diabetes mellitus without complications: Secondary | ICD-10-CM | POA: Diagnosis not present

## 2018-08-14 LAB — BASIC METABOLIC PANEL
BUN/Creatinine Ratio: 23 — ABNORMAL HIGH (ref 9–20)
BUN: 20 mg/dL (ref 6–24)
CALCIUM: 9.9 mg/dL (ref 8.7–10.2)
CHLORIDE: 98 mmol/L (ref 96–106)
CO2: 25 mmol/L (ref 20–29)
CREATININE: 0.88 mg/dL (ref 0.76–1.27)
GFR calc Af Amer: 111 mL/min/{1.73_m2} (ref >59)
GFR, EST NON AFRICAN AMERICAN: 96 mL/min/{1.73_m2} (ref >59)
GLUCOSE: 87 mg/dL (ref 65–99)
POTASSIUM: 4.5 mmol/L (ref 3.5–5.2)
SODIUM: 138 mmol/L (ref 134–144)

## 2018-08-14 LAB — TSH: TSH: 2.21 u[IU]/mL (ref 0.450–4.500)

## 2018-08-14 NOTE — Progress Notes (Signed)
All labs are normal. 

## 2018-10-22 ENCOUNTER — Other Ambulatory Visit: Payer: Self-pay | Admitting: Family Medicine

## 2018-10-22 DIAGNOSIS — E119 Type 2 diabetes mellitus without complications: Secondary | ICD-10-CM

## 2018-12-05 ENCOUNTER — Encounter: Payer: Self-pay | Admitting: Family Medicine

## 2018-12-05 ENCOUNTER — Ambulatory Visit (INDEPENDENT_AMBULATORY_CARE_PROVIDER_SITE_OTHER): Payer: BLUE CROSS/BLUE SHIELD | Admitting: Family Medicine

## 2018-12-05 VITALS — BP 104/64 | HR 72 | Ht 68.0 in | Wt 248.0 lb

## 2018-12-05 DIAGNOSIS — E119 Type 2 diabetes mellitus without complications: Secondary | ICD-10-CM

## 2018-12-05 DIAGNOSIS — K644 Residual hemorrhoidal skin tags: Secondary | ICD-10-CM | POA: Diagnosis not present

## 2018-12-05 DIAGNOSIS — E782 Mixed hyperlipidemia: Secondary | ICD-10-CM | POA: Diagnosis not present

## 2018-12-05 LAB — POCT GLYCOSYLATED HEMOGLOBIN (HGB A1C): Hemoglobin A1C: 6.1 % — AB (ref 4.0–5.6)

## 2018-12-05 LAB — POCT UA - MICROALBUMIN
Albumin/Creatinine Ratio, Urine, POC: <30
Creatinine, POC: 200 mg/dL
Microalbumin Ur, POC: 30 mg/L

## 2018-12-05 NOTE — Assessment & Plan Note (Signed)
To recheck lipids.  Plus discussed the benefits of starting a statin especially with diagnosis of diabetes and how it reduces heart disease and stroke.  He will think about it.

## 2018-12-05 NOTE — Assessment & Plan Note (Signed)
I would be happy to refer him at any point that he is ready to have surgical intervention.  I did not write a new prescription today as he feels like the topicals really are not very helpful.

## 2018-12-05 NOTE — Assessment & Plan Note (Addendum)
Well-controlled A1c looks fantastic today.  He is otherwise doing well I will see him back in about 4 months.  Urine microalbumin performed today.  Did discuss starting a statin he says he will think about it.

## 2018-12-05 NOTE — Progress Notes (Signed)
Established Patient Office Visit  Subjective:  Patient ID: Robert Ward, male    DOB: 19-May-1961  Age: 58 y.o. MRN: [id number]  CC:  Chief Complaint  Patient presents with  . Diabetes    HPI DARUS HERSHMAN presents for DM  Diabetes - no hypoglycemic events. No wounds or sores that are not healing well. No increased thirst or urination. Checking glucose at home. Taking medications as prescribed without any side effects.  Not been exercising recently since [place] started he has been working from home and has found it a little bit more difficult to start walking.  Then more recently he has been walking a little bit in the evenings when he walks his dog.  He reports his blood sugars have been running about 10 5 points higher on average.  He still having some flares and problems with his hemorrhoids.  He is not quite to the point where he wants to have surgical intervention again and says the creams that I gave him really were not very helpful.  He has been trying some witch hazel recently.   History reviewed. No pertinent past medical history.  Past Surgical History:  Procedure Laterality Date  . TONSILLECTOMY  6    Family History  Problem Relation Age of Onset  . COPD Mother        + smoker  . COPD Father        + smoker, died 78  . Heart attack Maternal Grandmother 60    Social History   Socioeconomic History  . Marital status: Married    Spouse name: Not on file  . Number of children: 1   . Years of education: Not on file  . Highest education level: Not on file  Occupational History  . Occupation: Chief Financial Officer    Comment: [employer]  Social Needs  . Financial resource strain: Not on file  . Food insecurity:    Worry: Not on file    Inability: Not on file  . Transportation needs:    Medical: Not on file    Non-medical: Not on file  Tobacco Use  . Smoking status: Never Smoker  . Smokeless tobacco: Never Used  Substance and Sexual Activity  . Alcohol use: Yes  .  Drug use: No  . Sexual activity: Yes    Partners: Female    Comment: works as a Programmer, multimedia, not married, 1 child, doesn't regularly exercise.  Lifestyle  . Physical activity:    Days per week: Not on file    Minutes per session: Not on file  . Stress: Not on file  Relationships  . Social connections:    Talks on phone: Not on file    Gets together: Not on file    Attends religious service: Not on file    Active member of club or organization: Not on file    Attends meetings of clubs or organizations: Not on file    Relationship status: Not on file  . Intimate partner violence:    Fear of current or ex partner: Not on file    Emotionally abused: Not on file    Physically abused: Not on file    Forced sexual activity: Not on file  Other Topics Concern  . Not on file  Social History Narrative   1 caffeine drink per day. Some regular exercise.      Outpatient Medications Prior to Visit  Medication Sig Dispense Refill  . GARLIC PO Take by mouth.    Marland Kitchen  glucose blood (CONTOUR NEXT TEST) test strip 1 each by Other route as needed for other. Use as instructed    . metFORMIN (GLUCOPHAGE-XR) 500 MG 24 hr tablet TAKE 1 TABLET DAILY WITH BREAKFAST 90 tablet 1  . Omega-3 Fatty Acids (FISH OIL PO) Take by mouth.    . AMBULATORY NON FORMULARY MEDICATION Medication Name: glucometer with lancets and strips to test once a day. Dx. Diabetes type 2.  12/06/17 (Patient taking differently: Medication Name: glucometer with lancets and strips to test once a day. Dx. Diabetes type 2.  12/06/17 Contour next) 1 Units PRN.  . MULTIPLE VITAMIN PO Take by mouth.     No facility-administered medications prior to visit.     No Known Allergies  ROS Review of Systems    Objective:    Physical Exam  Constitutional: He is oriented to person, place, and time. He appears well-developed and well-nourished.  HENT:  Head: Normocephalic and atraumatic.  Cardiovascular: Normal rate, regular rhythm and  normal heart sounds.  Pulmonary/Chest: Effort normal and breath sounds normal.  Neurological: He is alert and oriented to person, place, and time.  Skin: Skin is warm and dry.  Psychiatric: He has a normal mood and affect. His behavior is normal.    BP 104/64   Pulse 72   Ht '5\' 8"'  (1.727 m)   Wt 248 lb (112.5 kg)   SpO2 97%   BMI 37.71 kg/m  Wt Readings from Last 3 Encounters:  12/05/18 248 lb (112.5 kg)  08/08/18 239 lb (108.4 kg)  04/04/18 242 lb (109.8 kg)     Health Maintenance Due  Topic Date Due  . URINE MICROALBUMIN  01/04/1972    There are no preventive care reminders to display for this patient.  Lab Results  Component Value Date   TSH 2.210 08/13/2018   Lab Results  Component Value Date   WBC 12.8 (H) 11/29/2011   HGB 15.5 11/29/2011   HCT 45.0 11/29/2011   MCV 87.2 11/29/2011   PLT 283 11/29/2011   Lab Results  Component Value Date   NA 138 08/13/2018   K 4.5 08/13/2018   CO2 25 08/13/2018   GLUCOSE 87 08/13/2018   BUN 20 08/13/2018   CREATININE 0.88 08/13/2018   BILITOT 0.3 12/21/2016   ALKPHOS 59 12/21/2016   AST 21 12/21/2016   ALT 35 12/21/2016   PROT 6.7 12/21/2016   ALBUMIN 4.4 12/21/2016   CALCIUM 9.9 08/13/2018   Lab Results  Component Value Date   CHOL 214 (H) 12/14/2017   Lab Results  Component Value Date   HDL 26 (L) 12/14/2017   Lab Results  Component Value Date   LDLCALC 134 (H) 12/14/2017   Lab Results  Component Value Date   TRIG 269 (H) 12/14/2017   Lab Results  Component Value Date   CHOLHDL 8.2 (H) 12/14/2017   Lab Results  Component Value Date   HGBA1C 6.1 (A) 12/05/2018      Assessment & Plan:   Problem List Items Addressed This Visit      Cardiovascular and Mediastinum   External hemorrhoid    I would be happy to refer him at any point that he is ready to have surgical intervention.  I did not write a new prescription today as he feels like the topicals really are not very helpful.         Endocrine   Controlled type 2 diabetes mellitus without complication, without long-term current use of insulin ([place]) -  Primary    Well-controlled A1c looks fantastic today.  He is otherwise doing well I will see him back in about 4 months.  Urine microalbumin performed today.  Did discuss starting a statin he says he will think about it.      Relevant Orders   POCT glycosylated hemoglobin (Hb A1C) (Completed)   POCT UA - Microalbumin (Completed)   Lipid panel   CMP14+EGFR     Other   Hyperlipidemia    To recheck lipids.  Plus discussed the benefits of starting a statin especially with diagnosis of diabetes and how it reduces heart disease and stroke.  He will think about it.      Relevant Orders   Lipid panel   CMP14+EGFR     Discussed guidelines recommendation for starting a statin.  He declined Tdap but says he will check at work to see if they offer it for free and will consider getting it in the future.  He also reports that he completed his hepatitis A and B series and will bring [place] a copy of that as well at his next visit.  No orders of the defined types were placed in this encounter.   Follow-up: Return in about 4 months (around 04/07/2019) for Diabetes .    Beatrice Lecher, MD

## 2018-12-12 DIAGNOSIS — E782 Mixed hyperlipidemia: Secondary | ICD-10-CM | POA: Diagnosis not present

## 2018-12-12 DIAGNOSIS — E119 Type 2 diabetes mellitus without complications: Secondary | ICD-10-CM | POA: Diagnosis not present

## 2018-12-13 LAB — CMP14+EGFR
ALT: 31 IU/L (ref 0–44)
AST: 24 IU/L (ref 0–40)
Albumin/Globulin Ratio: 1.8 (ref 1.2–2.2)
Albumin: 4.2 g/dL (ref 3.8–4.9)
Alkaline Phosphatase: 60 IU/L (ref 39–117)
BUN/Creatinine Ratio: 18 (ref 9–20)
BUN: 18 mg/dL (ref 6–24)
Bilirubin Total: 0.4 mg/dL (ref 0.0–1.2)
CO2: 24 mmol/L (ref 20–29)
Calcium: 9.7 mg/dL (ref 8.7–10.2)
Chloride: 101 mmol/L (ref 96–106)
Creatinine, Ser: 0.99 mg/dL (ref 0.76–1.27)
GFR calc Af Amer: 98 mL/min/{1.73_m2} (ref >59)
GFR calc non Af Amer: 85 mL/min/{1.73_m2} (ref >59)
Globulin, Total: 2.4 g/dL (ref 1.5–4.5)
Glucose: 123 mg/dL — ABNORMAL HIGH (ref 65–99)
Potassium: 4.8 mmol/L (ref 3.5–5.2)
Sodium: 138 mmol/L (ref 134–144)
Total Protein: 6.6 g/dL (ref 6.0–8.5)

## 2018-12-13 LAB — LIPID PANEL
Chol/HDL Ratio: 7.4 ratio — ABNORMAL HIGH (ref 0.0–5.0)
Cholesterol, Total: 199 mg/dL (ref 100–199)
HDL: 27 mg/dL — ABNORMAL LOW (ref >39)
LDL Calculated: 119 mg/dL — ABNORMAL HIGH (ref 0–99)
Triglycerides: 264 mg/dL — ABNORMAL HIGH (ref 0–149)
VLDL Cholesterol Cal: 53 mg/dL — ABNORMAL HIGH (ref 5–40)

## 2019-04-08 LAB — HM DIABETES EYE EXAM

## 2019-04-09 ENCOUNTER — Ambulatory Visit: Payer: BLUE CROSS/BLUE SHIELD | Admitting: Family Medicine

## 2019-04-13 ENCOUNTER — Other Ambulatory Visit: Payer: Self-pay

## 2019-04-13 ENCOUNTER — Ambulatory Visit (INDEPENDENT_AMBULATORY_CARE_PROVIDER_SITE_OTHER): Payer: BC Managed Care – PPO | Admitting: Family Medicine

## 2019-04-13 ENCOUNTER — Encounter: Payer: Self-pay | Admitting: Family Medicine

## 2019-04-13 VITALS — BP 115/65 | HR 63 | Ht 68.0 in | Wt 258.0 lb

## 2019-04-13 DIAGNOSIS — H9313 Tinnitus, bilateral: Secondary | ICD-10-CM

## 2019-04-13 DIAGNOSIS — Z6839 Body mass index (BMI) 39.0-39.9, adult: Secondary | ICD-10-CM | POA: Diagnosis not present

## 2019-04-13 DIAGNOSIS — H9193 Unspecified hearing loss, bilateral: Secondary | ICD-10-CM | POA: Insufficient documentation

## 2019-04-13 DIAGNOSIS — E119 Type 2 diabetes mellitus without complications: Secondary | ICD-10-CM | POA: Diagnosis not present

## 2019-04-13 DIAGNOSIS — Z6838 Body mass index (BMI) 38.0-38.9, adult: Secondary | ICD-10-CM | POA: Insufficient documentation

## 2019-04-13 DIAGNOSIS — Z6835 Body mass index (BMI) 35.0-35.9, adult: Secondary | ICD-10-CM | POA: Insufficient documentation

## 2019-04-13 LAB — POCT GLYCOSYLATED HEMOGLOBIN (HGB A1C): Hemoglobin A1C: 6.2 % — AB (ref 4.0–5.6)

## 2019-04-13 MED ORDER — METFORMIN HCL ER 500 MG PO TB24: 500.0000 mg | ORAL_TABLET | Freq: Every day | ORAL | 1 refills | Status: DC

## 2019-04-13 NOTE — Assessment & Plan Note (Signed)
Refer to audiology for further work-up.

## 2019-04-13 NOTE — Assessment & Plan Note (Signed)
A1c looks fantastic today at 6.3.  I am concerned that his fastings are running between 1 30-1 40 more recently.  That is concerning that his next A1c will be elevated.  Encouraged him to really work on weight loss, regular exercise and really cutting back on portions and making good choices for follow-up.  Continue with metformin.

## 2019-04-13 NOTE — Progress Notes (Signed)
Established Patient Office Visit  Subjective:  Patient ID: Robert Ward, male    DOB: [birth year]  Age: 58 y.o. MRN: [id number]  CC:  Chief Complaint  Patient presents with  . Diabetes    HPI Robert Ward presents for   Diabetes - no hypoglycemic events. No wounds or sores that are not healing well. No increased thirst or urination. Checking glucose at home.  Most fasting sugars have recently been between 1 30-1 40.  He says it has been up a little bit more over the last couple of months.  Taking medications as prescribed without any side effects. Eye exam up to date.  Foot exam due today.   Says he has gained a fair amount of weight since working from home with COVID.  He says he is just very sedentary and tends to snack a little bit more since he is home.  Lateral hearing loss-we had discussed this previously and had offered to refer him for audiology consultation.  He says he is ready to do the consultation now.  He has got some ear ringing at times.  No past medical history on file.  Past Surgical History:  Procedure Laterality Date  . TONSILLECTOMY  5    Family History  Problem Relation Age of Onset  . COPD Mother        + smoker  . COPD Father        + smoker, died 104  . Heart attack Maternal Grandmother 51    Social History   Socioeconomic History  . Marital status: Married    Spouse name: Not on file  . Number of children: 1   . Years of education: Not on file  . Highest education level: Not on file  Occupational History  . Occupation: Art gallery manager    Comment: [employer]  Social Needs  . Financial resource strain: Not on file  . Food insecurity    Worry: Not on file    Inability: Not on file  . Transportation needs    Medical: Not on file    Non-medical: Not on file  Tobacco Use  . Smoking status: Never Smoker  . Smokeless tobacco: Never Used  Substance and Sexual Activity  . Alcohol use: Yes  . Drug use: No  . 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  Lifestyle  . Physical activity    Days per week: Not on file    Minutes per session: Not on file  . Stress: Not on file  Relationships  . Social Musician on phone: Not on file    Gets together: Not on file    Attends religious service: Not on file    Active member of club or organization: Not on file    Attends meetings of clubs or organizations: Not on file    Relationship status: Not on file  . Intimate partner violence    Fear of current or ex partner: Not on file    Emotionally abused: Not on file    Physically abused: Not on file    Forced sexual activity: Not on file  Other Topics Concern  . Not on file  Social History Narrative   1 caffeine drink per day. Some regular exercise.      Outpatient Medications Prior to Visit  Medication Sig Dispense Refill  . GARLIC PO Take by  mouth.    . glucose blood (CONTOUR NEXT TEST) test strip 1 each by Other route as needed for other. Use as instructed    . Omega-3 Fatty Acids (FISH OIL PO) Take by mouth.    . metFORMIN (GLUCOPHAGE-XR) 500 MG 24 hr tablet TAKE 1 TABLET DAILY WITH BREAKFAST 90 tablet 1   No facility-administered medications prior to visit.     No Known Allergies  ROS Review of Systems    Objective:    Physical Exam  Constitutional: He is oriented to person, place, and time. He appears well-developed and well-nourished.  HENT:  Head: Normocephalic and atraumatic.  Cardiovascular: Normal rate, regular rhythm and normal heart sounds.  Radial pulse 2+  Pulmonary/Chest: Effort normal and breath sounds normal.  Neurological: He is alert and oriented to person, place, and time.  Skin: Skin is warm and dry.  Psychiatric: He has a normal mood and affect. His behavior is normal.    BP 115/65   Pulse 63   Ht 5\' 8"  (1.727 m)   Wt 258 lb (117 kg)   SpO2 99%   BMI 39.23 kg/m  Wt Readings from Last  3 Encounters:  04/13/19 258 lb (117 kg)  12/05/18 248 lb (112.5 kg)  08/08/18 239 lb (108.4 kg)     Health Maintenance Due  Topic Date Due  . OPHTHALMOLOGY EXAM  04/04/2019    There are no preventive care reminders to display for this patient.  Lab Results  Component Value Date   TSH 2.210 08/13/2018   Lab Results  Component Value Date   WBC 12.8 (H) 11/29/2011   HGB 15.5 11/29/2011   HCT 45.0 11/29/2011   MCV 87.2 11/29/2011   PLT 283 11/29/2011   Lab Results  Component Value Date   NA 138 12/12/2018   K 4.8 12/12/2018   CO2 24 12/12/2018   GLUCOSE 123 (H) 12/12/2018   BUN 18 12/12/2018   CREATININE 0.99 12/12/2018   BILITOT 0.4 12/12/2018   ALKPHOS 60 12/12/2018   AST 24 12/12/2018   ALT 31 12/12/2018   PROT 6.6 12/12/2018   ALBUMIN 4.2 12/12/2018   CALCIUM 9.7 12/12/2018   Lab Results  Component Value Date   CHOL 199 12/12/2018   Lab Results  Component Value Date   HDL 27 (L) 12/12/2018   Lab Results  Component Value Date   LDLCALC 119 (H) 12/12/2018   Lab Results  Component Value Date   TRIG 264 (H) 12/12/2018   Lab Results  Component Value Date   CHOLHDL 7.4 (H) 12/12/2018   Lab Results  Component Value Date   HGBA1C 6.2 (A) 04/13/2019      Assessment & Plan:   Problem List Items Addressed This Visit      Endocrine   Controlled type 2 diabetes mellitus without complication, without long-term current use of insulin (HCC) - Primary    A1c looks fantastic today at 6.3.  I am concerned that his fastings are running between 1 30-1 40 more recently.  That is concerning that his next A1c will be elevated.  Encouraged him to really work on weight loss, regular exercise and really cutting back on portions and making good choices for follow-up.  Continue with metformin.      Relevant Medications   metFORMIN (GLUCOPHAGE-XR) 500 MG 24 hr tablet   Other Relevant Orders   POCT glycosylated hemoglobin (Hb A1C) (Completed)     Nervous and  Auditory   Bilateral hearing loss  Refer to audiology for further work-up.      Relevant Orders   Ambulatory referral to ENT     Other   BMI 39.0-39.9,adult    BMI 39 today.  Has gained some weight since working from home during the [place] pandemic.  We discussed strategies around increasing activity level including going up and down the stairs and taking a lot during his lunch break.  Also discussed limiting treats and sweets at home especially when shopping for the household during the week.       Other Visit Diagnoses    Tinnitus of both ears       Relevant Orders   Ambulatory referral to ENT      Tdap delayed he will get next time.    Meds ordered this encounter  Medications  . metFORMIN (GLUCOPHAGE-XR) 500 MG 24 hr tablet    Sig: Take 1 tablet (500 mg total) by mouth daily with breakfast.    Dispense:  90 tablet    Refill:  1    Follow-up: Return in about 4 months (around 08/14/2019) for Diabetes follow-up.    Beatrice Lecher, MD

## 2019-04-13 NOTE — Assessment & Plan Note (Signed)
BMI 39 today.  Has gained some weight since working from home during the [place] pandemic.  We discussed strategies around increasing activity level including going up and down the stairs and taking a lot during his lunch break.  Also discussed limiting treats and sweets at home especially when shopping for the household during the week.

## 2019-04-27 DIAGNOSIS — H9313 Tinnitus, bilateral: Secondary | ICD-10-CM | POA: Diagnosis not present

## 2019-04-27 DIAGNOSIS — H6123 Impacted cerumen, bilateral: Secondary | ICD-10-CM | POA: Diagnosis not present

## 2019-04-27 DIAGNOSIS — H903 Sensorineural hearing loss, bilateral: Secondary | ICD-10-CM | POA: Diagnosis not present

## 2019-08-10 ENCOUNTER — Ambulatory Visit: Payer: BC Managed Care – PPO | Admitting: Family Medicine

## 2019-08-10 ENCOUNTER — Other Ambulatory Visit: Payer: Self-pay

## 2019-08-10 ENCOUNTER — Encounter: Payer: Self-pay | Admitting: Family Medicine

## 2019-08-10 VITALS — BP 126/66 | HR 64 | Ht 68.0 in | Wt 262.0 lb

## 2019-08-10 DIAGNOSIS — E119 Type 2 diabetes mellitus without complications: Secondary | ICD-10-CM

## 2019-08-10 DIAGNOSIS — R197 Diarrhea, unspecified: Secondary | ICD-10-CM | POA: Diagnosis not present

## 2019-08-10 LAB — POCT GLYCOSYLATED HEMOGLOBIN (HGB A1C): Hemoglobin A1C: 6.2 % — AB (ref 4.0–5.6)

## 2019-08-10 MED ORDER — FARXIGA 5 MG PO TABS: 5.0000 mg | ORAL_TABLET | Freq: Every day | ORAL | 3 refills | Status: DC

## 2019-08-10 MED ORDER — FARXIGA 5 MG PO TABS: 5.0000 mg | ORAL_TABLET | Freq: Every day | ORAL | 0 refills | Status: DC

## 2019-08-10 NOTE — Progress Notes (Signed)
Established Patient Office Visit  Subjective:  Patient ID: Robert Ward, male    DOB: [birth year]  Age: 59 y.o. MRN: [id number]  CC:  Chief Complaint  Patient presents with  . Diabetes    HPI Robert Ward presents for   Diabetes - no hypoglycemic events. No wounds or sores that are not healing well. No increased thirst or urination. Checking glucose at home. Taking medications as prescribed with some recent onset of diarrhea.Marland Kitchen  He is not currently exercising.  He says starting about 3 months ago everyone in the house had cold symptoms.  He says that resolved but ever since then his blood sugars have been running a little bit higher with typical fastings running around 150 which is about 20 points higher than what he was previously seeing.  He also noticed around that time he started having loose stools daily.  About 4 to 5 hours after he would take the Metformin he would have a loose bowel movement with a little bit of cramping and then would feel better.  He needs noticed on days where he skipped his metformin that he did not have any bowel symptoms.  As far as he is aware the Metformin did not change colors or brands etc.  He has not had any fever chills or sweats or blood in the stool.   No past medical history on file.  Past Surgical History:  Procedure Laterality Date  . TONSILLECTOMY  5    Family History  Problem Relation Age of Onset  . COPD Mother        + smoker  . COPD Father        + smoker, died 22  . Heart attack Maternal Grandmother 19    Social History   Socioeconomic History  . Marital status: Married    Spouse name: Not on file  . Number of children: 1   . Years of education: Not on file  . Highest education level: Not on file  Occupational History  . Occupation: Art gallery manager    Comment: [employer]  Tobacco Use  . Smoking status: Never Smoker  . Smokeless tobacco: Never Used  Substance and Sexual Activity  . Alcohol use: Yes  . Drug use: No  . 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  Other Topics Concern  . Not on file  Social History Narrative   1 caffeine drink per day. Some regular exercise.     Social Determinants of Health   Financial Resource Strain:   . Difficulty of Paying Living Expenses: Not on file  Food Insecurity:   . Worried About Programme researcher, broadcasting/film/video in the Last Year: Not on file  . Ran Out of Food in the Last Year: Not on file  Transportation Needs:   . Lack of Transportation (Medical): Not on file  . Lack of Transportation (Non-Medical): Not on file  Physical Activity:   . Days of Exercise per Week: Not on file  . Minutes of Exercise per Session: Not on file  Stress:   . Feeling of Stress : Not on file  Social Connections:   . Frequency of Communication with Friends and Family: Not on file  . Frequency of Social Gatherings with Friends and Family: Not on file  . Attends Religious Services: Not on file  . Active Member of Clubs or Organizations: Not on file  .  Attends Archivist Meetings: Not on file  . Marital Status: Not on file  Intimate Partner Violence:   . Fear of Current or Ex-Partner: Not on file  . Emotionally Abused: Not on file  . Physically Abused: Not on file  . Sexually Abused: Not on file    Outpatient Medications Prior to Visit  Medication Sig Dispense Refill  . GARLIC PO Take by mouth.    Marland Kitchen glucose blood (CONTOUR NEXT TEST) test strip 1 each by Other route as needed for other. Use as instructed    . Omega-3 Fatty Acids (FISH OIL PO) Take by mouth.    . metFORMIN (GLUCOPHAGE-XR) 500 MG 24 hr tablet Take 1 tablet (500 mg total) by mouth daily with breakfast. 90 tablet 1   No facility-administered medications prior to visit.    No Known Allergies  ROS Review of Systems    Objective:    Physical Exam  Constitutional: He is oriented to person, place, and time. He appears  well-developed and well-nourished.  HENT:  Head: Normocephalic and atraumatic.  Cardiovascular: Normal rate, regular rhythm and normal heart sounds.  Pulmonary/Chest: Effort normal and breath sounds normal.  Neurological: He is alert and oriented to person, place, and time.  Skin: Skin is warm and dry.  Psychiatric: He has a normal mood and affect. His behavior is normal.    BP 126/66   Pulse 64   Ht 5\' 8"  (1.727 m)   Wt 262 lb (118.8 kg)   SpO2 99%   BMI 39.84 kg/m  Wt Readings from Last 3 Encounters:  08/10/19 262 lb (118.8 kg)  04/13/19 258 lb (117 kg)  12/05/18 248 lb (112.5 kg)     Health Maintenance Due  Topic Date Due  . OPHTHALMOLOGY EXAM  04/04/2019    There are no preventive care reminders to display for this patient.  Lab Results  Component Value Date   TSH 2.210 08/13/2018   Lab Results  Component Value Date   WBC 12.8 (H) 11/29/2011   HGB 15.5 11/29/2011   HCT 45.0 11/29/2011   MCV 87.2 11/29/2011   PLT 283 11/29/2011   Lab Results  Component Value Date   NA 138 12/12/2018   K 4.8 12/12/2018   CO2 24 12/12/2018   GLUCOSE 123 (H) 12/12/2018   BUN 18 12/12/2018   CREATININE 0.99 12/12/2018   BILITOT 0.4 12/12/2018   ALKPHOS 60 12/12/2018   AST 24 12/12/2018   ALT 31 12/12/2018   PROT 6.6 12/12/2018   ALBUMIN 4.2 12/12/2018   CALCIUM 9.7 12/12/2018   Lab Results  Component Value Date   CHOL 199 12/12/2018   Lab Results  Component Value Date   HDL 27 (L) 12/12/2018   Lab Results  Component Value Date   LDLCALC 119 (H) 12/12/2018   Lab Results  Component Value Date   TRIG 264 (H) 12/12/2018   Lab Results  Component Value Date   CHOLHDL 7.4 (H) 12/12/2018   Lab Results  Component Value Date   HGBA1C 6.2 (A) 08/10/2019      Assessment & Plan:   Problem List Items Addressed This Visit      Endocrine   Controlled type 2 diabetes mellitus without complication, without long-term current use of insulin ([place]) - Primary    Gust  options.  At him a little suspicious that may be his brand of metformin or generic version has changed.  But we talked about checking and seeing if he could switch  back to previous brand or trying just a different medication he is most interested in just trying something different completely he would really like to resolve the diarrhea.  He says he is only put up with it this long because he is actually been able to work from home.  Plan to follow-up in 3 to 4 months.  Continue to work on diet, portion control and trying to really increase regular exercise.      Relevant Medications   dapagliflozin propanediol (FARXIGA) 5 MG TABS tablet   Other Relevant Orders   POCT HgB A1C (Completed)    Other Visit Diagnoses    Diarrhea, unspecified type          Diarrhea-most likely medication side effect.  Will discontinue Metformin for now.  He is already on the extended release version which is usually better tolerated.  It may be that the generic version that he is taking his change.  A1c still looks great at 6.2.  If diarrhea does not resolve after switching medication then please let me know.  Meds ordered this encounter  Medications  . dapagliflozin propanediol (FARXIGA) 5 MG TABS tablet    Sig: Take 5 mg by mouth daily before breakfast.    Dispense:  30 tablet    Refill:  3    Follow-up: No follow-ups on file.    Nani Gasser, MD

## 2019-08-10 NOTE — Assessment & Plan Note (Signed)
Gust options.  At him a little suspicious that may be his brand of metformin or generic version has changed.  But we talked about checking and seeing if he could switch back to previous brand or trying just a different medication he is most interested in just trying something different completely he would really like to resolve the diarrhea.  He says he is only put up with it this long because he is actually been able to work from home.  Plan to follow-up in 3 to 4 months.  Continue to work on diet, portion control and trying to really increase regular exercise.

## 2019-08-10 NOTE — Addendum Note (Signed)
Addended by: Jed Limerick on: 08/10/2019 01:48 PM   Modules accepted: Orders

## 2019-08-15 ENCOUNTER — Emergency Department
Admission: EM | Admit: 2019-08-15 | Discharge: 2019-08-15 | Disposition: A | Discharge status: Home / Self Care | Payer: BC Managed Care – PPO | Source: Home / Self Care

## 2019-08-15 ENCOUNTER — Other Ambulatory Visit: Payer: Self-pay

## 2019-08-15 ENCOUNTER — Encounter: Payer: Self-pay | Admitting: Emergency Medicine

## 2019-08-15 DIAGNOSIS — H60391 Other infective otitis externa, right ear: Secondary | ICD-10-CM | POA: Diagnosis not present

## 2019-08-15 MED ORDER — CIPROFLOXACIN HCL 500 MG PO TABS: 500.0000 mg | ORAL_TABLET | Freq: Two times a day (BID) | ORAL | 0 refills | Status: DC

## 2019-08-15 MED ORDER — NEOMYCIN-POLYMYXIN-HC 3.5-10000-1 OT SOLN: 3.0000 [drp] | Freq: Four times a day (QID) | OTIC | 0 refills | Status: DC

## 2019-08-15 NOTE — ED Triage Notes (Signed)
Patient c/o right ear pain, no other sx's, no injury to the ear.

## 2019-08-15 NOTE — ED Provider Notes (Signed)
[facility]    CSN: [id number] Arrival date & time: 08/15/19  1216      History   Chief Complaint Chief Complaint  Patient presents with  . Otalgia    HPI CRAWFORD TAMURA is a 59 y.o. male.   This is an established 59 year old man who comes in with right ear pain occurring spontaneously for past day.  He is an established [facility] patient.  Patient has a history of bilateral hearing loss on his problem list, but he states that he has no problem hearing.   No other sx's, no injury to the ear.  He has a h/o cerumen impaction.  The pain was very uncomfortable until he took an Excedrin a couple hours ago.  Also, the ear hurts when one pulls on it.     History reviewed. No pertinent past medical history.  Patient Active Problem List   Diagnosis Date Noted  . BMI 39.0-39.9,adult 04/13/2019  . Bilateral hearing loss 04/13/2019  . Controlled type 2 diabetes mellitus without complication, without long-term current use of insulin ([place]) 04/04/2018  . External hemorrhoid 12/31/2013  . Impingement syndrome of left shoulder 09/08/2012  . Obesity 01/11/2012  . Hyperlipidemia 03/23/2011    Past Surgical History:  Procedure Laterality Date  . TONSILLECTOMY  5       Home Medications    Prior to Admission medications   Medication Sig Start Date End Date Taking? Authorizing Provider  dapagliflozin propanediol (FARXIGA) 5 MG TABS tablet Take 5 mg by mouth daily before breakfast. 08/10/19  Yes Hali Marry, MD  GARLIC PO Take by mouth.   Yes [provider]  glucose blood (CONTOUR NEXT TEST) test strip 1 each by Other route as needed for other. Use as instructed   Yes [provider]  Omega-3 Fatty Acids (FISH OIL PO) Take by mouth.   Yes [provider]  ciprofloxacin (CIPRO) 500 MG tablet Take 1 tablet (500 mg total) by mouth 2 (two) times daily. 08/15/19   Robyn Haber, MD  neomycin-polymyxin-hydrocortisone  (CORTISPORIN) OTIC solution Place 3 drops into the right ear 4 (four) times daily. 08/15/19   Robyn Haber, MD    Family History Family History  Problem Relation Age of Onset  . COPD Mother        + smoker  . COPD Father        + smoker, died 13  . Heart attack Maternal Grandmother 60    Social History Social History   Tobacco Use  . Smoking status: Never Smoker  . Smokeless tobacco: Never Used  Substance Use Topics  . Alcohol use: Yes  . Drug use: No     Allergies   Patient has no known allergies.   Review of Systems Review of Systems  HENT: Positive for ear pain. Negative for hearing loss.   All other systems reviewed and are negative.    Physical Exam Triage Vital Signs ED Triage Vitals [08/15/19 1241]  Enc Vitals Group     BP 139/79     Pulse Rate 76     Resp      Temp 98.5 F (36.9 C)     Temp Source Oral     SpO2 96 %     Weight 262 lb (118.8 kg)     Height 5\' 9"  (1.753 m)     Head Circumference      Peak Flow      Pain Score 3  Pain Loc      Pain Edu?      Excl. in GC?    No data found.  Updated Vital Signs BP 139/79 (BP Location: Left Arm)   Pulse 76   Temp 98.5 F (36.9 C) (Oral)   Ht 5\' 9"  (1.753 m)   Wt 118.8 kg   SpO2 96%   BMI 38.69 kg/m    Physical Exam Vitals and nursing note reviewed.  Constitutional:      General: He is not in acute distress.    Appearance: Normal appearance. He is obese. He is not ill-appearing or toxic-appearing.  HENT:     Head: Normocephalic.     Right Ear: Tympanic membrane normal.     Ears:     Comments: Left ear and canal:   Normal  Right ear:  Nontender, some wax in canal, moderate canal swelling.    Mouth/Throat:     Mouth: Mucous membranes are moist.  Eyes:     Conjunctiva/sclera: Conjunctivae normal.  Cardiovascular:     Rate and Rhythm: Normal rate.  Pulmonary:     Effort: Pulmonary effort is normal.  Musculoskeletal:        General: Normal range of motion.     Cervical back:  Normal range of motion and neck supple.  Lymphadenopathy:     Cervical: No cervical adenopathy.  Skin:    General: Skin is warm and dry.  Neurological:     General: No focal deficit present.     Mental Status: He is alert and oriented to person, place, and time.  Psychiatric:        Mood and Affect: Mood normal.        Behavior: Behavior normal.        Thought Content: Thought content normal.        Judgment: Judgment normal.      UC Treatments / Results  Labs (all labs ordered are listed, but only abnormal results are displayed) Labs Reviewed - No data to display  EKG   Radiology No results found.  Procedures Procedures (including critical care time)  Medications Ordered in UC Medications - No data to display  Initial Impression / Assessment and Plan / UC Course  I have reviewed the triage vital signs and the nursing notes.  Pertinent labs & imaging results that were available during my care of the patient were reviewed by me and considered in my medical decision making (see chart for details).    Final Clinical Impressions(s) / UC Diagnoses   Final diagnoses:  Other infective acute otitis externa of right ear   Discharge Instructions   None    ED Prescriptions    Medication Sig Dispense Auth. Provider   neomycin-polymyxin-hydrocortisone (CORTISPORIN) OTIC solution Place 3 drops into the right ear 4 (four) times daily. 10 mL , MD   ciprofloxacin (CIPRO) 500 MG tablet Take 1 tablet (500 mg total) by mouth 2 (two) times daily. 10 tablet Elvina Sidle, MD     I have reviewed the PDMP during this encounter.   Elvina Sidle, MD 08/15/19 [id number]

## 2019-09-22 ENCOUNTER — Encounter: Payer: Self-pay | Admitting: Family Medicine

## 2019-10-05 ENCOUNTER — Ambulatory Visit: Payer: BC Managed Care – PPO | Attending: Internal Medicine

## 2019-10-05 DIAGNOSIS — Z23 Encounter for immunization: Secondary | ICD-10-CM

## 2019-10-05 NOTE — Progress Notes (Signed)
   Covid-19 Vaccination Clinic  Name:  Robert Ward    MRN: [id number] DOB: 07-01-1961  10/05/2019  [name] was observed post Covid-19 immunization for 15 minutes without incident. He was provided with Vaccine Information Sheet and instruction to access the V-Safe system.   [name] was instructed to call 911 with any severe reactions post vaccine: Marland Kitchen Difficulty breathing  . Swelling of face and throat  . A fast heartbeat  . A bad rash all over body  . Dizziness and weakness   Immunizations Administered    Name Date Dose VIS Date Route   Pfizer COVID-19 Vaccine 10/05/2019  2:01 PM 0.3 mL 06/19/2019 Intramuscular   Manufacturer: [employer], [employer]   Lot: [id number]   NDC: [address]-3

## 2019-10-28 ENCOUNTER — Ambulatory Visit: Payer: BC Managed Care – PPO | Attending: Internal Medicine

## 2019-10-28 DIAGNOSIS — Z23 Encounter for immunization: Secondary | ICD-10-CM

## 2019-10-28 NOTE — Progress Notes (Signed)
   Covid-19 Vaccination Clinic  Name:  JADIAN KARMAN    MRN: [id number] DOB: 19-Jun-1961  10/28/2019  Mr. Walkowiak was observed post Covid-19 immunization for 15 minutes without incident. He was provided with Vaccine Information Sheet and instruction to access the V-Safe system.   Mr. Beamer was instructed to call 911 with any severe reactions post vaccine: Marland Kitchen Difficulty breathing  . Swelling of face and throat  . A fast heartbeat  . A bad rash all over body  . Dizziness and weakness   Immunizations Administered    Name Date Dose VIS Date Route   Pfizer COVID-19 Vaccine 10/28/2019  9:27 AM 0.3 mL 09/02/2018 Intramuscular   Manufacturer: [employer], [employer]   Lot: [id number]   NDC: [address]-9

## 2019-11-16 ENCOUNTER — Ambulatory Visit (INDEPENDENT_AMBULATORY_CARE_PROVIDER_SITE_OTHER): Payer: BC Managed Care – PPO | Admitting: Family Medicine

## 2019-11-16 ENCOUNTER — Encounter: Payer: Self-pay | Admitting: Family Medicine

## 2019-11-16 VITALS — BP 120/68 | HR 58 | Ht 68.0 in | Wt 254.0 lb

## 2019-11-16 DIAGNOSIS — Z6839 Body mass index (BMI) 39.0-39.9, adult: Secondary | ICD-10-CM

## 2019-11-16 DIAGNOSIS — E119 Type 2 diabetes mellitus without complications: Secondary | ICD-10-CM | POA: Diagnosis not present

## 2019-11-16 DIAGNOSIS — Z125 Encounter for screening for malignant neoplasm of prostate: Secondary | ICD-10-CM | POA: Diagnosis not present

## 2019-11-16 LAB — POCT GLYCOSYLATED HEMOGLOBIN (HGB A1C): Hemoglobin A1C: 6.3 % — AB (ref 4.0–5.6)

## 2019-11-16 MED ORDER — FARXIGA 5 MG PO TABS: 5.0000 mg | ORAL_TABLET | Freq: Every day | ORAL | 1 refills | Status: DC

## 2019-11-16 MED ORDER — METFORMIN HCL ER 500 MG PO TB24: 500.0000 mg | ORAL_TABLET | Freq: Every day | ORAL | 1 refills | Status: DC

## 2019-11-16 NOTE — Progress Notes (Signed)
Established Patient Office Visit  Subjective:  Patient ID: Robert Ward, male    DOB: [birth year]  Age: 59 y.o. MRN: [id number]  CC:  Chief Complaint  Patient presents with  . Diabetes    HPI Robert Ward presents for   Diabetes - no hypoglycemic events. No wounds or sores that are not healing well. No increased thirst or urination. Checking glucose at home. Taking medications as prescribed without any side effects.  He noticed a big difference in his blood sugar since coming off of Metformin.  He says his average is around 158.  He is actually down 8 pounds.  He did not let me know he recently visit his mother she has a diagnosis of COPD and has now a pacemaker he said when he was there he checked his oxygen level and it was actually 94%.  History reviewed. No pertinent past medical history.  Past Surgical History:  Procedure Laterality Date  . TONSILLECTOMY  6    Family History  Problem Relation Age of Onset  . COPD Mother        + smoker  . Heart Problems Mother        Pacemaker.   Marland Kitchen COPD Father        + smoker, died 28  . Heart attack Maternal Grandmother 60    Social History   Socioeconomic History  . Marital status: Married    Spouse name: Not on file  . Number of children: 1   . Years of education: Not on file  . Highest education level: Not on file  Occupational History  . Occupation: Chief Financial Officer    Comment: [employer]  Tobacco Use  . Smoking status: Never Smoker  . Smokeless tobacco: Never Used  Substance and Sexual Activity  . Alcohol use: Yes  . Drug use: No  . Sexual activity: Yes    Partners: Female    Comment: works as a Programmer, multimedia, not married, 1 child, doesn't regularly exercise.  Other Topics Concern  . Not on file  Social History Narrative   1 caffeine drink per day. Some regular exercise.     Social Determinants of Health   Financial Resource Strain:   . Difficulty of Paying Living Expenses:   Food Insecurity:   . Worried  About Charity fundraiser in the Last Year:   . Arboriculturist in the Last Year:   Transportation Needs:   . Film/video editor (Medical):   Marland Kitchen Lack of Transportation (Non-Medical):   Physical Activity:   . Days of Exercise per Week:   . Minutes of Exercise per Session:   Stress:   . Feeling of Stress :   Social Connections:   . Frequency of Communication with Friends and Family:   . Frequency of Social Gatherings with Friends and Family:   . Attends Religious Services:   . Active Member of Clubs or Organizations:   . Attends Archivist Meetings:   Marland Kitchen Marital Status:   Intimate Partner Violence:   . Fear of Current or Ex-Partner:   . Emotionally Abused:   Marland Kitchen Physically Abused:   . Sexually Abused:     Outpatient Medications Prior to Visit  Medication Sig Dispense Refill  . GARLIC PO Take by mouth.    Marland Kitchen glucose blood (CONTOUR NEXT TEST) test strip 1 each by Other route as needed for other. Use as instructed    . Omega-3 Fatty Acids (FISH OIL PO) Take  by mouth.    . dapagliflozin propanediol (FARXIGA) 5 MG TABS tablet Take 5 mg by mouth daily before breakfast. 90 tablet 0  . ciprofloxacin (CIPRO) 500 MG tablet Take 1 tablet (500 mg total) by mouth 2 (two) times daily. 10 tablet 0  . neomycin-polymyxin-hydrocortisone (CORTISPORIN) OTIC solution Place 3 drops into the right ear 4 (four) times daily. 10 mL 0   No facility-administered medications prior to visit.    No Known Allergies  ROS Review of Systems    Objective:    Physical Exam  Constitutional: He is oriented to person, place, and time. He appears well-developed and well-nourished.  HENT:  Head: Normocephalic and atraumatic.  Cardiovascular: Normal rate, regular rhythm and normal heart sounds.  Radial pulse 2+  Pulmonary/Chest: Effort normal and breath sounds normal.  Neurological: He is alert and oriented to person, place, and time.  Skin: Skin is warm and dry.  Psychiatric: He has a normal mood  and affect. His behavior is normal.    BP 120/68   Pulse (!) 58   Ht 5' 8" (1.727 m)   Wt 254 lb (115.2 kg)   SpO2 98%   BMI 38.62 kg/m  Wt Readings from Last 3 Encounters:  11/16/19 254 lb (115.2 kg)  08/15/19 262 lb (118.8 kg)  08/10/19 262 lb (118.8 kg)     Health Maintenance Due  Topic Date Due  . URINE MICROALBUMIN  12/05/2019    There are no preventive care reminders to display for this patient.  Lab Results  Component Value Date   TSH 2.210 08/13/2018   Lab Results  Component Value Date   WBC 12.8 (H) 11/29/2011   HGB 15.5 11/29/2011   HCT 45.0 11/29/2011   MCV 87.2 11/29/2011   PLT 283 11/29/2011   Lab Results  Component Value Date   NA 138 12/12/2018   K 4.8 12/12/2018   CO2 24 12/12/2018   GLUCOSE 123 (H) 12/12/2018   BUN 18 12/12/2018   CREATININE 0.99 12/12/2018   BILITOT 0.4 12/12/2018   ALKPHOS 60 12/12/2018   AST 24 12/12/2018   ALT 31 12/12/2018   PROT 6.6 12/12/2018   ALBUMIN 4.2 12/12/2018   CALCIUM 9.7 12/12/2018   Lab Results  Component Value Date   CHOL 199 12/12/2018   Lab Results  Component Value Date   HDL 27 (L) 12/12/2018   Lab Results  Component Value Date   LDLCALC 119 (H) 12/12/2018   Lab Results  Component Value Date   TRIG 264 (H) 12/12/2018   Lab Results  Component Value Date   CHOLHDL 7.4 (H) 12/12/2018   Lab Results  Component Value Date   HGBA1C 6.3 (A) 11/16/2019      Assessment & Plan:   Problem List Items Addressed This Visit      Endocrine   Controlled type 2 diabetes mellitus without complication, without long-term current use of insulin ([place]) - Primary    Discussed treatment options.  We initially held the foreman because of some stool urgency.  We discussed the option of restarting versus maybe trying something like glipizide but did warn about potential for weight gain.  He would like to restart the Metformin instead and just see how he does with it.  Thus we will discontinue the [place] and  go back to Metformin.  Also consider could increase the [place].      Relevant Medications   metFORMIN (GLUCOPHAGE-XR) 500 MG 24 hr tablet   Other Relevant Orders  POCT glycosylated hemoglobin (Hb A1C) (Completed)   COMPLETE METABOLIC PANEL WITH GFR   Lipid panel   PSA   CMP14+EGFR     Other   BMI 39.0-39.9,adult    Job with the weight loss.  That he says his weight can fluctuate up and down about 10 pounds.  Just encouraged him to continue to work on healthy choices and regular exercise.       Other Visit Diagnoses    Screening for prostate cancer       Relevant Orders   Lipid panel   PSA   CMP14+EGFR      Family history of COPD-he says he had a pulse ox from 94% when he was at his mother's house we checked it today and it looked great and reassuring he has been asymptomatic without any counter cough or shortness of breath.  Meds ordered this encounter  Medications  . metFORMIN (GLUCOPHAGE-XR) 500 MG 24 hr tablet    Sig: Take 1 tablet (500 mg total) by mouth daily with breakfast.    Dispense:  90 tablet    Refill:  1  . DISCONTD: dapagliflozin propanediol (FARXIGA) 5 MG TABS tablet    Sig: Take 5 mg by mouth daily before breakfast.    Dispense:  90 tablet    Refill:  1    Follow-up: Return in about 4 months (around 03/18/2020) for Diabetes follow-up.    Beatrice Lecher, MD

## 2019-11-16 NOTE — Assessment & Plan Note (Addendum)
Discussed treatment options.  We initially held the foreman because of some stool urgency.  We discussed the option of restarting versus maybe trying something like glipizide but did warn about potential for weight gain.  He would like to restart the Metformin instead and just see how he does with it.  Thus we will discontinue the [place] and go back to Metformin.  Also consider could increase the [place].

## 2019-11-16 NOTE — Assessment & Plan Note (Signed)
Job with the weight loss.  That he says his weight can fluctuate up and down about 10 pounds.  Just encouraged him to continue to work on healthy choices and regular exercise.

## 2019-11-20 DIAGNOSIS — E119 Type 2 diabetes mellitus without complications: Secondary | ICD-10-CM | POA: Diagnosis not present

## 2019-11-20 DIAGNOSIS — Z125 Encounter for screening for malignant neoplasm of prostate: Secondary | ICD-10-CM | POA: Diagnosis not present

## 2019-11-21 LAB — CMP14+EGFR
ALT: 46 IU/L — ABNORMAL HIGH (ref 0–44)
AST: 27 IU/L (ref 0–40)
Albumin/Globulin Ratio: 2 (ref 1.2–2.2)
Albumin: 4.7 g/dL (ref 3.8–4.9)
Alkaline Phosphatase: 78 IU/L (ref 39–117)
BUN/Creatinine Ratio: 15 (ref 9–20)
BUN: 15 mg/dL (ref 6–24)
Bilirubin Total: 0.5 mg/dL (ref 0.0–1.2)
CO2: 27 mmol/L (ref 20–29)
Calcium: 9.7 mg/dL (ref 8.7–10.2)
Chloride: 101 mmol/L (ref 96–106)
Creatinine, Ser: 0.99 mg/dL (ref 0.76–1.27)
GFR calc Af Amer: 97 mL/min/{1.73_m2} (ref >59)
GFR calc non Af Amer: 84 mL/min/{1.73_m2} (ref >59)
Globulin, Total: 2.4 g/dL (ref 1.5–4.5)
Glucose: 134 mg/dL — ABNORMAL HIGH (ref 65–99)
Potassium: 4.9 mmol/L (ref 3.5–5.2)
Sodium: 141 mmol/L (ref 134–144)
Total Protein: 7.1 g/dL (ref 6.0–8.5)

## 2019-11-21 LAB — LIPID PANEL
Chol/HDL Ratio: 8.3 ratio — ABNORMAL HIGH (ref 0.0–5.0)
Cholesterol, Total: 233 mg/dL — ABNORMAL HIGH (ref 100–199)
HDL: 28 mg/dL — ABNORMAL LOW (ref >39)
LDL Chol Calc (NIH): 141 mg/dL — ABNORMAL HIGH (ref 0–99)
Triglycerides: 347 mg/dL — ABNORMAL HIGH (ref 0–149)
VLDL Cholesterol Cal: 64 mg/dL — ABNORMAL HIGH (ref 5–40)

## 2019-11-21 LAB — PSA: Prostate Specific Ag, Serum: 0.3 ng/mL (ref 0.0–4.0)

## 2020-01-06 ENCOUNTER — Encounter: Payer: Self-pay | Admitting: Family Medicine

## 2020-01-08 ENCOUNTER — Encounter: Payer: Self-pay | Admitting: Family Medicine

## 2020-01-08 NOTE — Progress Notes (Unsigned)
Colon entered 

## 2020-03-09 ENCOUNTER — Emergency Department
Admission: EM | Admit: 2020-03-09 | Discharge: 2020-03-09 | Disposition: A | Discharge status: Home / Self Care | Payer: BC Managed Care – PPO | Source: Home / Self Care

## 2020-03-09 ENCOUNTER — Other Ambulatory Visit: Payer: Self-pay

## 2020-03-09 DIAGNOSIS — H66002 Acute suppurative otitis media without spontaneous rupture of ear drum, left ear: Secondary | ICD-10-CM | POA: Diagnosis not present

## 2020-03-09 HISTORY — DX: Atherosclerotic heart disease of native coronary artery without angina pectoris: I25.10

## 2020-03-09 HISTORY — DX: Type 2 diabetes mellitus without complications: E11.9

## 2020-03-09 MED ORDER — AMOXICILLIN-POT CLAVULANATE 875-125 MG PO TABS: 1.0000 | ORAL_TABLET | Freq: Two times a day (BID) | ORAL | 0 refills | Status: AC

## 2020-03-09 NOTE — ED Provider Notes (Signed)
[facility]    CSN: [id number] Arrival date & time: 03/09/20  1039      History   Chief Complaint Chief Complaint  Patient presents with  . Otalgia    HPI Robert Ward is a 59 y.o. male.   HPI Patient presents today with left otalgia x 2 days. Patient report intensive pain involving the left ear with episodes of persistent throbbing. No recent swimming. Endorses otalgia problems in the past involving right ear. Denies any associated URI symptoms. Afebrile.  Past Medical History:  Diagnosis Date  . Coronary artery disease   . Diabetes mellitus without complication [facility])     Patient Active Problem List   Diagnosis Date Noted  . BMI 39.0-39.9,adult 04/13/2019  . Bilateral hearing loss 04/13/2019  . Controlled type 2 diabetes mellitus without complication, without long-term current use of insulin ([facility]) 04/04/2018  . External hemorrhoid 12/31/2013  . Impingement syndrome of left shoulder 09/08/2012  . Obesity 01/11/2012  . Hyperlipidemia 03/23/2011    Past Surgical History:  Procedure Laterality Date  . TONSILLECTOMY  6       Home Medications    Prior to Admission medications   Medication Sig Start Date End Date Taking? Authorizing Provider  Dapagliflozin Propanediol (FARXIGA PO) Take by mouth.   Yes [provider]  glucose blood (CONTOUR NEXT TEST) test strip 1 each by Other route as needed for other. Use as instructed   Yes [provider]  metFORMIN (GLUCOPHAGE-XR) 500 MG 24 hr tablet Take 1 tablet (500 mg total) by mouth daily with breakfast. 11/16/19  Yes Agapito Games, MD  Omega-3 Fatty Acids (FISH OIL PO) Take by mouth.   Yes [provider]  GARLIC PO Take by mouth.    [provider]    Family History Family History  Problem Relation Age of Onset  . COPD Mother        + smoker  . Heart Problems Mother        Pacemaker.   Marland Kitchen COPD Father        + smoker, died 15  . Heart attack Maternal  Grandmother 26    Social History Social History   Tobacco Use  . Smoking status: Never Smoker  . Smokeless tobacco: Never Used  Vaping Use  . Vaping Use: Never used  Substance Use Topics  . Alcohol use: Yes    Comment: occasional   . Drug use: No     Allergies   Patient has no known allergies.   Review of Systems Review of Systems Pertinent negatives listed in HPI  Physical Exam Triage Vital Signs ED Triage Vitals  Enc Vitals Group     BP 03/09/20 1130 125/80     Pulse Rate 03/09/20 1130 69     Resp 03/09/20 1130 18     Temp 03/09/20 1130 98.3 F (36.8 C)     Temp Source 03/09/20 1130 Oral     SpO2 03/09/20 1130 97 %     Weight --      Height --      Head Circumference --      Peak Flow --      Pain Score 03/09/20 1128 3     Pain Loc --      Pain Edu? --      Excl. in GC? --    No data found.  Updated Vital Signs BP 125/80 (BP Location: Left Arm)   Pulse 69   Temp 98.3  F (36.8 C) (Oral)   Resp 18   SpO2 97%   Visual Acuity Right Eye Distance:   Left Eye Distance:   Bilateral Distance:    Right Eye Near:   Left Eye Near:    Bilateral Near:     Physical Exam  General Appearance:    Alert, cooperative, no distress  HENT:   left TM red, dull, bulging, ear canal swelling   Eyes:    PERRL, conjunctiva/corneas clear, EOM's intact       Lungs:     Clear to auscultation bilaterally, respirations unlabored  Heart:    Regular rate and rhythm  Neurologic:   Awake, alert, oriented x 3. No apparent focal neurological           defect.         UC Treatments / Results  Labs (all labs ordered are listed, but only abnormal results are displayed) Labs Reviewed - No data to display  EKG   Radiology No results found.  Procedures Procedures (including critical care time)  Medications Ordered in UC Medications - No data to display  Initial Impression / Assessment and Plan / UC Course  I have reviewed the triage vital signs and the nursing  notes.  Pertinent labs & imaging results that were available during my care of the patient were reviewed by me and considered in my medical decision making (see chart for details).    Acute otitis media, treating with Augmentin BID x 7 days. Tylenol and Ibuprofen PRN as needed for pain. An After Visit Summary was printed and given to the patient/family. Precautions discussed. Red flags discussed. Questions invited and answered.They voiced understanding and agreement.  Final Clinical Impressions(s) / UC Diagnoses   Final diagnoses:  Non-recurrent acute suppurative otitis media of left ear without spontaneous rupture of tympanic membrane   Discharge Instructions   None    ED Prescriptions    Medication Sig Dispense Auth. Provider   amoxicillin-clavulanate (AUGMENTIN) 875-125 MG tablet Take 1 tablet by mouth 2 (two) times daily for 7 days. 14 tablet Bing Neighbors, [name]     PDMP not reviewed this encounter.   Bing Neighbors, [name] 03/09/20 1345

## 2020-03-09 NOTE — ED Triage Notes (Signed)
Pt reports L ear pain x 2 days.  Improving since then but still painful.  Has bilateral tinnitus at baseline but worsened tinnitus in L ear at this time.  No drainage.  No fever.

## 2020-04-13 LAB — HM DIABETES EYE EXAM

## 2020-05-12 ENCOUNTER — Other Ambulatory Visit: Payer: Self-pay

## 2020-05-12 ENCOUNTER — Ambulatory Visit (INDEPENDENT_AMBULATORY_CARE_PROVIDER_SITE_OTHER): Payer: BC Managed Care – PPO | Admitting: Family Medicine

## 2020-05-12 ENCOUNTER — Encounter: Payer: Self-pay | Admitting: Family Medicine

## 2020-05-12 VITALS — BP 119/59 | HR 60 | Ht 68.0 in | Wt 257.0 lb

## 2020-05-12 DIAGNOSIS — E119 Type 2 diabetes mellitus without complications: Secondary | ICD-10-CM | POA: Diagnosis not present

## 2020-05-12 DIAGNOSIS — R748 Abnormal levels of other serum enzymes: Secondary | ICD-10-CM | POA: Diagnosis not present

## 2020-05-12 LAB — POCT GLYCOSYLATED HEMOGLOBIN (HGB A1C): Hemoglobin A1C: 6.5 % — AB (ref 4.0–5.6)

## 2020-05-12 LAB — POCT UA - MICROALBUMIN
Albumin/Creatinine Ratio, Urine, POC: <30
Creatinine, POC: 100 mg/dL
Microalbumin Ur, POC: 10 mg/L

## 2020-05-12 NOTE — Assessment & Plan Note (Addendum)
A1C is up a little from previous, but still acceptable at 6.5.  Really encouraged him to work on cutting back on portion control and making those better food choices he says he knows what he should be eating but just does not quite make the best choices we did discuss even considering switching his [place] to Trulicity or Ozempic.  He says for now he will stick with the Marcelline Deist he is due for updated blood work.  Otherwise I will see him back in 3 to 4 months.  Microalbumin is negative.

## 2020-05-12 NOTE — Progress Notes (Signed)
Established Patient Office Visit  Subjective:  Patient ID: Robert Ward, male    DOB: 1960/08/18  Age: 59 y.o. MRN: [id number]  CC:  Chief Complaint  Patient presents with  . Diabetes    eye care center [facility] [phone]    HPI Robert Ward presents for  Diabetes - no hypoglycemic events. No wounds or sores that are not healing well. No increased thirst or urination. Checking glucose at home. Taking medications as prescribed without any side effects.   Past Medical History:  Diagnosis Date  . Coronary artery disease   . Diabetes mellitus without complication [employer] [facility])     Past Surgical History:  Procedure Laterality Date  . TONSILLECTOMY  6    Family History  Problem Relation Age of Onset  . COPD Mother        + smoker  . Heart Problems Mother        Pacemaker.   Marland Kitchen COPD Father        + smoker, died 22  . Heart attack Maternal Grandmother 60    Social History   Socioeconomic History  . Marital status: Married    Spouse name: Not on file  . Number of children: 1   . Years of education: Not on file  . Highest education level: Not on file  Occupational History  . Occupation: Chief Financial Officer    Comment: [employer]  Tobacco Use  . Smoking status: Never Smoker  . Smokeless tobacco: Never Used  Vaping Use  . Vaping Use: Never used  Substance and Sexual Activity  . Alcohol use: Yes    Comment: occasional   . Drug use: No  . Sexual activity: Yes    Partners: Female    Comment: works as a Programmer, multimedia, not married, 1 child, doesn't regularly exercise.  Other Topics Concern  . Not on file  Social History Narrative   1 caffeine drink per day. Some regular exercise.     Social Determinants of Health   Financial Resource Strain:   . Difficulty of Paying Living Expenses: Not on file  Food Insecurity:   . Worried About Charity fundraiser in the Last Year: Not on file  . Ran Out of Food in the Last Year: Not on file  Transportation Needs:   . Lack of  Transportation (Medical): Not on file  . Lack of Transportation (Non-Medical): Not on file  Physical Activity:   . Days of Exercise per Week: Not on file  . Minutes of Exercise per Session: Not on file  Stress:   . Feeling of Stress : Not on file  Social Connections:   . Frequency of Communication with Friends and Family: Not on file  . Frequency of Social Gatherings with Friends and Family: Not on file  . Attends Religious Services: Not on file  . Active Member of Clubs or Organizations: Not on file  . Attends Archivist Meetings: Not on file  . Marital Status: Not on file  Intimate Partner Violence:   . Fear of Current or Ex-Partner: Not on file  . Emotionally Abused: Not on file  . Physically Abused: Not on file  . Sexually Abused: Not on file    Outpatient Medications Prior to Visit  Medication Sig Dispense Refill  . FARXIGA 5 MG TABS tablet Take 5 mg by mouth daily.    Marland Kitchen GARLIC PO Take by mouth.    Marland Kitchen glucose blood (CONTOUR NEXT TEST) test strip 1 each by  Other route as needed for other. Use as instructed    . metFORMIN (GLUCOPHAGE-XR) 500 MG 24 hr tablet Take 1 tablet (500 mg total) by mouth daily with breakfast. 90 tablet 1  . Omega-3 Fatty Acids (FISH OIL PO) Take by mouth.    . Dapagliflozin Propanediol (FARXIGA PO) Take by mouth.     No facility-administered medications prior to visit.    No Known Allergies  ROS Review of Systems    Objective:    Physical Exam Constitutional:      Appearance: He is well-developed.  HENT:     Head: Normocephalic and atraumatic.  Cardiovascular:     Rate and Rhythm: Normal rate and regular rhythm.     Heart sounds: Normal heart sounds.  Pulmonary:     Effort: Pulmonary effort is normal.     Breath sounds: Normal breath sounds.  Skin:    General: Skin is warm and dry.  Neurological:     Mental Status: He is alert and oriented to person, place, and time.  Psychiatric:        Behavior: Behavior normal.     BP  (!) 119/59   Pulse 60   Ht '5\' 8"'  (1.727 m)   Wt 257 lb (116.6 kg)   SpO2 98%   BMI 39.08 kg/m  Wt Readings from Last 3 Encounters:  05/12/20 257 lb (116.6 kg)  11/16/19 254 lb (115.2 kg)  08/15/19 262 lb (118.8 kg)     There are no preventive care reminders to display for this patient.  There are no preventive care reminders to display for this patient.  Lab Results  Component Value Date   TSH 2.210 08/13/2018   Lab Results  Component Value Date   WBC 12.8 (H) 11/29/2011   HGB 15.5 11/29/2011   HCT 45.0 11/29/2011   MCV 87.2 11/29/2011   PLT 283 11/29/2011   Lab Results  Component Value Date   NA 141 11/20/2019   K 4.9 11/20/2019   CO2 27 11/20/2019   GLUCOSE 134 (H) 11/20/2019   BUN 15 11/20/2019   CREATININE 0.99 11/20/2019   BILITOT 0.5 11/20/2019   ALKPHOS 78 11/20/2019   AST 27 11/20/2019   ALT 46 (H) 11/20/2019   PROT 7.1 11/20/2019   ALBUMIN 4.7 11/20/2019   CALCIUM 9.7 11/20/2019   Lab Results  Component Value Date   CHOL 233 (H) 11/20/2019   Lab Results  Component Value Date   HDL 28 (L) 11/20/2019   Lab Results  Component Value Date   LDLCALC 141 (H) 11/20/2019   Lab Results  Component Value Date   TRIG 347 (H) 11/20/2019   Lab Results  Component Value Date   CHOLHDL 8.3 (H) 11/20/2019   Lab Results  Component Value Date   HGBA1C 6.5 (A) 05/12/2020      Assessment & Plan:   Problem List Items Addressed This Visit      Endocrine   Controlled type 2 diabetes mellitus without complication, without long-term current use of insulin ([facility]) - Primary    A1C is up a little from previous, but still acceptable at 6.5.  Really encouraged him to work on cutting back on portion control and making those better food choices he says he knows what he should be eating but just does not quite make the best choices we did discuss even considering switching his [place] to Trulicity or Ozempic.  He says for now he will stick with the Wilder Glade he is due  for updated blood work.  Otherwise I will see him back in 3 to 4 months.      Relevant Medications   FARXIGA 5 MG TABS tablet   Other Relevant Orders   POCT glycosylated hemoglobin (Hb A1C) (Completed)   POCT UA - Microalbumin (Completed)   COMPLETE METABOLIC PANEL WITH GFR   CMP14+EGFR   Hepatitis panel, acute    Other Visit Diagnoses    Elevated liver enzymes       Relevant Orders   CMP14+EGFR   Hepatitis panel, acute     Elevated liver enzymes recent labs in May showed a mild elevation in one of his liver enzymes discussed that this is most often caused by fatty liver.  We will start by rechecking the enzymes as well as a hepatitis panel.  If still elevated then consider ultrasound for further work-up.  No orders of the defined types were placed in this encounter.   Follow-up: Return in about 3 months (around 08/12/2020) for Diabetes follow-up.    Beatrice Lecher, MD

## 2020-05-19 ENCOUNTER — Encounter: Payer: Self-pay | Admitting: Family Medicine

## 2020-05-19 DIAGNOSIS — E119 Type 2 diabetes mellitus without complications: Secondary | ICD-10-CM

## 2020-05-20 ENCOUNTER — Other Ambulatory Visit: Payer: Self-pay | Admitting: *Deleted

## 2020-05-20 DIAGNOSIS — E119 Type 2 diabetes mellitus without complications: Secondary | ICD-10-CM | POA: Diagnosis not present

## 2020-05-20 DIAGNOSIS — R748 Abnormal levels of other serum enzymes: Secondary | ICD-10-CM | POA: Diagnosis not present

## 2020-05-20 MED ORDER — FARXIGA 5 MG PO TABS
5.0000 mg | ORAL_TABLET | Freq: Every day | ORAL | 0 refills | Status: DC
Start: 2020-05-20 — End: 2020-08-15

## 2020-05-20 NOTE — Telephone Encounter (Signed)
I saw in the result notes for his lipid panel that you wanted to start him on a statin but it was never sent in.  There are two really old prescriptions for atorvastatin and pravastatin in his chart.

## 2020-05-21 LAB — CMP14+EGFR
ALT: 47 IU/L — ABNORMAL HIGH (ref 0–44)
AST: 22 IU/L (ref 0–40)
Albumin/Globulin Ratio: 1.8 (ref 1.2–2.2)
Albumin: 4.3 g/dL (ref 3.8–4.9)
Alkaline Phosphatase: 72 IU/L (ref 44–121)
BUN/Creatinine Ratio: 18 (ref 9–20)
BUN: 18 mg/dL (ref 6–24)
Bilirubin Total: 0.5 mg/dL (ref 0.0–1.2)
CO2: 25 mmol/L (ref 20–29)
Calcium: 9.8 mg/dL (ref 8.7–10.2)
Chloride: 99 mmol/L (ref 96–106)
Creatinine, Ser: 0.98 mg/dL (ref 0.76–1.27)
GFR calc Af Amer: 98 mL/min/{1.73_m2} (ref >59)
GFR calc non Af Amer: 85 mL/min/{1.73_m2} (ref >59)
Globulin, Total: 2.4 g/dL (ref 1.5–4.5)
Glucose: 120 mg/dL — ABNORMAL HIGH (ref 65–99)
Potassium: 4.5 mmol/L (ref 3.5–5.2)
Sodium: 138 mmol/L (ref 134–144)
Total Protein: 6.7 g/dL (ref 6.0–8.5)

## 2020-05-21 LAB — HEPATITIS PANEL, ACUTE
Hep A IgM: NEGATIVE
Hep B C IgM: NEGATIVE
Hep C Virus Ab: <0.1 s/co ratio (ref 0.0–0.9)
Hepatitis B Surface Ag: NEGATIVE

## 2020-05-23 ENCOUNTER — Other Ambulatory Visit: Payer: Self-pay | Admitting: *Deleted

## 2020-05-23 ENCOUNTER — Encounter: Payer: Self-pay | Admitting: Family Medicine

## 2020-05-23 DIAGNOSIS — E119 Type 2 diabetes mellitus without complications: Secondary | ICD-10-CM

## 2020-05-23 MED ORDER — ATORVASTATIN CALCIUM 10 MG PO TABS: 10.0000 mg | ORAL_TABLET | Freq: Every day | ORAL | 3 refills | Status: DC

## 2020-05-23 MED ORDER — METFORMIN HCL ER 500 MG PO TB24: 500.0000 mg | ORAL_TABLET | Freq: Every day | ORAL | 1 refills | Status: DC

## 2020-05-23 NOTE — Telephone Encounter (Signed)
Scription for statin sent to pharmacy.  Lets just try the [facility] for now.

## 2020-08-15 ENCOUNTER — Other Ambulatory Visit: Payer: Self-pay | Admitting: Family Medicine

## 2020-08-22 ENCOUNTER — Other Ambulatory Visit: Payer: Self-pay | Admitting: *Deleted

## 2020-08-22 DIAGNOSIS — E119 Type 2 diabetes mellitus without complications: Secondary | ICD-10-CM

## 2020-08-22 MED ORDER — ATORVASTATIN CALCIUM 10 MG PO TABS: 10.0000 mg | ORAL_TABLET | Freq: Every day | ORAL | 3 refills | Status: DC

## 2020-12-02 ENCOUNTER — Ambulatory Visit: Payer: BC Managed Care – PPO | Admitting: Family Medicine

## 2020-12-02 ENCOUNTER — Encounter: Payer: Self-pay | Admitting: Family Medicine

## 2020-12-02 ENCOUNTER — Ambulatory Visit (INDEPENDENT_AMBULATORY_CARE_PROVIDER_SITE_OTHER): Payer: BC Managed Care – PPO

## 2020-12-02 ENCOUNTER — Other Ambulatory Visit: Payer: Self-pay

## 2020-12-02 VITALS — BP 125/56 | HR 65 | Ht 68.0 in | Wt 254.0 lb

## 2020-12-02 DIAGNOSIS — R21 Rash and other nonspecific skin eruption: Secondary | ICD-10-CM | POA: Diagnosis not present

## 2020-12-02 DIAGNOSIS — E119 Type 2 diabetes mellitus without complications: Secondary | ICD-10-CM | POA: Diagnosis not present

## 2020-12-02 DIAGNOSIS — E782 Mixed hyperlipidemia: Secondary | ICD-10-CM

## 2020-12-02 DIAGNOSIS — R059 Cough, unspecified: Secondary | ICD-10-CM | POA: Diagnosis not present

## 2020-12-02 DIAGNOSIS — Z6838 Body mass index (BMI) 38.0-38.9, adult: Secondary | ICD-10-CM | POA: Diagnosis not present

## 2020-12-02 DIAGNOSIS — Z23 Encounter for immunization: Secondary | ICD-10-CM

## 2020-12-02 LAB — POCT GLYCOSYLATED HEMOGLOBIN (HGB A1C): HbA1c, POC (controlled diabetic range): 6.9 % (ref 0.0–7.0)

## 2020-12-02 MED ORDER — DAPAGLIFLOZIN PRO-METFORMIN ER 10-1000 MG PO TB24: 1.0000 | ORAL_TABLET | Freq: Every day | ORAL | 0 refills | Status: DC

## 2020-12-02 NOTE — Progress Notes (Signed)
Established Patient Office Visit  Subjective:  Patient ID: Robert Ward, male    DOB: Aug 13, 1960  Age: 60 y.o. MRN: [id number]  CC:  Chief Complaint  Patient presents with  . Diabetes    HPI Robert Ward presents for   Diabetes - no hypoglycemic events. No wounds or sores that are not healing well. No increased thirst or urination. Checking glucose at home.  Currently just taking the [place].  Ports he is not been doing that great with his diet recently.  Hyperlipidemia-tolerating the statin well without any side effects or problems.  Though he did run out a few days ago.  He also reports that maybe every other day for the last 6 months he is just noticed in the morning that when he first gets up he will have a little bit of a cough with mucus or sometimes a sneeze with mucus he says its not typical for him he has not had any fever chills sore throat etc. no shortness of breath.  He denies any heartburn or reflux.  Denies any significant sinus congestion.  He says he usually feels fine throughout the day.  And he denies throat clearing during the day.   Past Medical History:  Diagnosis Date  . Coronary artery disease   . Diabetes mellitus without complication [facility])     Past Surgical History:  Procedure Laterality Date  . TONSILLECTOMY  6    Family History  Problem Relation Age of Onset  . COPD Mother        + smoker  . Heart Problems Mother        Pacemaker.   Marland Kitchen COPD Father        + smoker, died 54  . Heart attack Maternal Grandmother 60    Social History   Socioeconomic History  . Marital status: Married    Spouse name: Not on file  . Number of children: 1   . Years of education: Not on file  . Highest education level: Not on file  Occupational History  . Occupation: Chief Financial Officer    Comment: [employer]  Tobacco Use  . Smoking status: Never Smoker  . Smokeless tobacco: Never Used  Vaping Use  . Vaping Use: Never used  Substance and Sexual Activity  . Alcohol  use: Yes    Comment: occasional   . Drug use: No  . Sexual activity: Yes    Partners: Female    Comment: works as a Programmer, multimedia, not married, 1 child, doesn't regularly exercise.  Other Topics Concern  . Not on file  Social History Narrative   1 caffeine drink per day. Some regular exercise.     Social Determinants of Health   Financial Resource Strain: Not on file  Food Insecurity: Not on file  Transportation Needs: Not on file  Physical Activity: Not on file  Stress: Not on file  Social Connections: Not on file  Intimate Partner Violence: Not on file    Outpatient Medications Prior to Visit  Medication Sig Dispense Refill  . atorvastatin (LIPITOR) 10 MG tablet Take 1 tablet (10 mg total) by mouth at bedtime. 90 tablet 3  . FARXIGA 5 MG TABS tablet TAKE 1 TABLET DAILY 90 tablet 3  . GARLIC PO Take by mouth.    Marland Kitchen glucose blood (CONTOUR NEXT TEST) test strip 1 each by Other route as needed for other. Use as instructed    . Omega-3 Fatty Acids (FISH OIL PO) Take by mouth.    Marland Kitchen  metFORMIN (GLUCOPHAGE-XR) 500 MG 24 hr tablet Take 1 tablet (500 mg total) by mouth daily with breakfast. 90 tablet 1   No facility-administered medications prior to visit.    No Known Allergies  ROS Review of Systems    Objective:    Physical Exam Constitutional:      Appearance: He is well-developed.  HENT:     Head: Normocephalic and atraumatic.  Cardiovascular:     Rate and Rhythm: Normal rate and regular rhythm.     Heart sounds: Normal heart sounds.  Pulmonary:     Effort: Pulmonary effort is normal.     Breath sounds: Normal breath sounds.  Skin:    General: Skin is warm and dry.  Neurological:     Mental Status: He is alert and oriented to person, place, and time.  Psychiatric:        Behavior: Behavior normal.     BP (!) 125/56   Pulse 65   Ht '5\' 8"'  (1.727 m)   Wt 254 lb (115.2 kg)   SpO2 98%   BMI 38.62 kg/m  Wt Readings from Last 3 Encounters:  12/02/20 254  lb (115.2 kg)  05/12/20 257 lb (116.6 kg)  11/16/19 254 lb (115.2 kg)     Health Maintenance Due  Topic Date Due  . PNEUMOCOCCAL POLYSACCHARIDE VACCINE AGE 57-64 HIGH RISK  Never done  . COVID-19 Vaccine (3 - Booster for Pfizer series) 03/29/2020    There are no preventive care reminders to display for this patient.  Lab Results  Component Value Date   TSH 2.210 08/13/2018   Lab Results  Component Value Date   WBC 12.8 (H) 11/29/2011   HGB 15.5 11/29/2011   HCT 45.0 11/29/2011   MCV 87.2 11/29/2011   PLT 283 11/29/2011   Lab Results  Component Value Date   NA 138 05/20/2020   K 4.5 05/20/2020   CO2 25 05/20/2020   GLUCOSE 120 (H) 05/20/2020   BUN 18 05/20/2020   CREATININE 0.98 05/20/2020   BILITOT 0.5 05/20/2020   ALKPHOS 72 05/20/2020   AST 22 05/20/2020   ALT 47 (H) 05/20/2020   PROT 6.7 05/20/2020   ALBUMIN 4.3 05/20/2020   CALCIUM 9.8 05/20/2020   Lab Results  Component Value Date   CHOL 233 (H) 11/20/2019   Lab Results  Component Value Date   HDL 28 (L) 11/20/2019   Lab Results  Component Value Date   LDLCALC 141 (H) 11/20/2019   Lab Results  Component Value Date   TRIG 347 (H) 11/20/2019   Lab Results  Component Value Date   CHOLHDL 8.3 (H) 11/20/2019   Lab Results  Component Value Date   HGBA1C 6.9 12/02/2020      Assessment & Plan:   Problem List Items Addressed This Visit      Endocrine   Controlled type 2 diabetes mellitus without complication, without long-term current use of insulin ([place]) - Primary    He reports he is only been taking the [place] and not the metformin.  Organ to combine them back together into 1 pill to simplify things for him I think this will make a big difference in his A1c really encouraged him to work on healthy diet and regular exercise which she admits he is not currently doing.  He has lost a couple of pounds which is great but just encouraged him to continue to work at it.      Relevant Medications    Dapagliflozin-metFORMIN HCl ER  04-999 MG TB24   Other Relevant Orders   POCT glycosylated hemoglobin (Hb A1C) (Completed)   CMP14+EGFR   Lipid panel     Other   Hyperlipidemia    Tolerating statin thus far well.  Due to recheck LDL to see if he is at goal.  He was initially on 40 mg and then 10 mg its not clear why it was changed.  I would suspect he is got a need a higher dose to control his lipids but we can see where we are at in the make any adjustments needed.      Relevant Orders   CMP14+EGFR   Lipid panel   BMI 38.0-38.9,adult    Discussed cutting back on carbs and sugars and really working on increasing activity level and working on weight loss as well.  Which would in turn improve his blood pressure and cholesterol.       Other Visit Diagnoses    Cough       Relevant Orders   DG Chest 2 View     Cough/ mucous -he says just a slight increase for 6 months nothing persistent nothing daily but it is enough that he has paid attention to it so we discussed potential causes including postnasal drip, GERD etc.  I think infection at this point is much less likely.  We did discuss getting a chest x-Robert just to be thorough and if that is negative then we can always consider treatment for reflux.  And get this point if the chest x-Robert is normal he feels more comfortable just watching it for now he says it is so mild that even if he took something he is not sure he would really notice a significant change or benefit.  Meds ordered this encounter  Medications  . Dapagliflozin-metFORMIN HCl ER 04-999 MG TB24    Sig: Take 1 tablet by mouth daily.    Dispense:  90 tablet    Refill:  0    Follow-up: No follow-ups on file.    Beatrice Lecher, MD

## 2020-12-02 NOTE — Assessment & Plan Note (Signed)
He reports he is only been taking the [place] and not the metformin.  Organ to combine them back together into 1 pill to simplify things for him I think this will make a big difference in his A1c really encouraged him to work on healthy diet and regular exercise which she admits he is not currently doing.  He has lost a couple of pounds which is great but just encouraged him to continue to work at it.

## 2020-12-02 NOTE — Assessment & Plan Note (Signed)
Tolerating statin thus far well.  Due to recheck LDL to see if he is at goal.  He was initially on 40 mg and then 10 mg its not clear why it was changed.  I would suspect he is got a need a higher dose to control his lipids but we can see where we are at in the make any adjustments needed.

## 2020-12-02 NOTE — Assessment & Plan Note (Signed)
Discussed cutting back on carbs and sugars and really working on increasing activity level and working on weight loss as well.  Which would in turn improve his blood pressure and cholesterol.

## 2021-02-24 ENCOUNTER — Other Ambulatory Visit: Payer: Self-pay | Admitting: Family Medicine

## 2021-02-24 DIAGNOSIS — E119 Type 2 diabetes mellitus without complications: Secondary | ICD-10-CM

## 2021-03-03 ENCOUNTER — Other Ambulatory Visit: Payer: Self-pay | Admitting: Family Medicine

## 2021-03-03 DIAGNOSIS — E119 Type 2 diabetes mellitus without complications: Secondary | ICD-10-CM

## 2021-03-06 MED ORDER — DAPAGLIFLOZIN PRO-METFORMIN ER 10-1000 MG PO TB24: 1.0000 | ORAL_TABLET | Freq: Every day | ORAL | 0 refills | Status: DC

## 2021-03-24 ENCOUNTER — Encounter: Payer: Self-pay | Admitting: Family Medicine

## 2021-03-24 ENCOUNTER — Ambulatory Visit: Payer: BC Managed Care – PPO | Admitting: Family Medicine

## 2021-03-24 ENCOUNTER — Other Ambulatory Visit: Payer: Self-pay

## 2021-03-24 VITALS — BP 111/53 | HR 68 | Ht 68.0 in | Wt 246.0 lb

## 2021-03-24 DIAGNOSIS — H60332 Swimmer's ear, left ear: Secondary | ICD-10-CM | POA: Diagnosis not present

## 2021-03-24 DIAGNOSIS — E119 Type 2 diabetes mellitus without complications: Secondary | ICD-10-CM | POA: Diagnosis not present

## 2021-03-24 LAB — POCT GLYCOSYLATED HEMOGLOBIN (HGB A1C): Hemoglobin A1C: 6.6 % — AB (ref 4.0–5.6)

## 2021-03-24 MED ORDER — OFLOXACIN 0.3 % OT SOLN: 5.0000 [drp] | Freq: Every day | OTIC | 0 refills | Status: DC

## 2021-03-24 NOTE — Assessment & Plan Note (Signed)
A1c looks great today at 6.6.  Great improvement from last time at 6.9.  He is tolerating the medication well without side effects or problems and he is lost some weight just encouraged him to continue to work on trying to get into some type of regular exercise routine and continue to work on healthy food choices and portion control.

## 2021-03-24 NOTE — Progress Notes (Signed)
[facility]  Established Patient Office Visit  Subjective:  Patient ID: Robert Ward, male    DOB: Nov 15, 1960  Age: 60 y.o. MRN: [id number]  CC:  Chief Complaint  Patient presents with   Diabetes    HPI Robert Ward presents for   Diabetes - no hypoglycemic events. No wounds or sores that are not healing well. No increased thirst or urination. Checking glucose at home. Taking medications as prescribed without any side effects.  Feels like combining the [place] the metformin has been great he feels like it is working well and has not had any side effects or problems.  He is lost about another 8 pounds since I last saw him he states he is back in the office again instead of working from home and its actually helped he feels like he is moving a little more as well though he is not exercising specifically.  He says that his left ear started bothering him a couple days ago which is felt completely clogged and a little bit uncomfortable.  He said he really only had about 1 day of discomfort he says he feels like it is actually opened up a little bit since then.  He was concerned that it might have wax. No other sxs.   Past Medical History:  Diagnosis Date   Coronary artery disease    Diabetes mellitus without complication (HCC)     Past Surgical History:  Procedure Laterality Date   TONSILLECTOMY  6    Family History  Problem Relation Age of Onset   COPD Mother        + smoker   Heart Problems Mother        Pacemaker.    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  Other Topics Concern   Not on file  Social History Narrative   1 caffeine drink per day. Some regular exercise.     Social Determinants of Health   Financial Resource Strain: Not on file  Food Insecurity: Not on file  Transportation Needs: Not on file  Physical Activity: Not on file  Stress: Not on file  Social Connections: Not on file  Intimate Partner Violence: Not on file    Outpatient Medications Prior to Visit  Medication Sig Dispense Refill   atorvastatin (LIPITOR) 10 MG tablet Take 1 tablet (10 mg total) by mouth at bedtime. 90 tablet 3   Dapagliflozin-metFORMIN HCl ER 04-999 MG TB24 Take 1 tablet by mouth daily. 90 tablet 0   GARLIC PO Take by mouth.     glucose blood test strip 1 each by Other route as needed for other. Use as instructed     Omega-3  Fatty Acids (FISH OIL PO) Take by mouth.     FARXIGA 5 MG TABS tablet TAKE 1 TABLET DAILY 90 tablet 3   No facility-administered medications prior to visit.    No Known Allergies  ROS Review of Systems    Objective:    Physical Exam Constitutional:      Appearance: Normal appearance. He is well-developed.  HENT:     Head: Normocephalic and atraumatic.     Right Ear: External ear normal.     Left Ear: External ear normal.     Ears:     Comments: Right canal is about 90% occluded with cerumen in the left canal is about 90% as well but with more white discoloration. Cardiovascular:     Rate and Rhythm: Normal rate and regular rhythm.     Heart sounds: Normal heart sounds.  Pulmonary:     Effort: Pulmonary effort is normal.     Breath sounds: Normal breath sounds.  Skin:    General: Skin is warm and dry.  Neurological:     Mental Status: He is alert and oriented to person, place, and time. Mental  status is at baseline.  Psychiatric:        Behavior: Behavior normal.    BP (!) 111/53   Pulse 68   Ht 5\' 8"  (1.727 m)   Wt 246 lb (111.6 kg)   SpO2 97%   BMI 37.40 kg/m  Wt Readings from Last 3 Encounters:  03/24/21 246 lb (111.6 kg)  12/02/20 254 lb (115.2 kg)  05/12/20 257 lb (116.6 kg)     Health Maintenance Due  Topic Date Due   COVID-19 Vaccine (3 - Booster for Pfizer series) 03/29/2020   URINE MICROALBUMIN  05/12/2021    There are no preventive care reminders to display for this patient.  Lab Results  Component Value Date   TSH 2.210 08/13/2018   Lab Results  Component Value Date   WBC 12.8 (H) 11/29/2011   HGB 15.5 11/29/2011   HCT 45.0 11/29/2011   MCV 87.2 11/29/2011   PLT 283 11/29/2011   Lab Results  Component Value Date   NA 138 05/20/2020   K 4.5 05/20/2020   CO2 25 05/20/2020   GLUCOSE 120 (H) 05/20/2020   BUN 18 05/20/2020   CREATININE 0.98 05/20/2020   BILITOT 0.5 05/20/2020   ALKPHOS 72 05/20/2020   AST 22 05/20/2020   ALT 47 (H) 05/20/2020   PROT 6.7 05/20/2020   ALBUMIN 4.3 05/20/2020   CALCIUM 9.8 05/20/2020   Lab Results  Component Value Date   CHOL 233 (H) 11/20/2019   Lab Results  Component Value Date   HDL 28 (L) 11/20/2019   Lab Results  Component Value Date   LDLCALC 141 (H) 11/20/2019   Lab Results  Component Value Date   TRIG 347 (H) 11/20/2019   Lab Results  Component Value Date   CHOLHDL 8.3 (H) 11/20/2019   Lab Results  Component Value Date   HGBA1C 6.6 (A) 03/24/2021      Assessment & Plan:   Problem List Items Addressed This Visit       Endocrine   Controlled type 2 diabetes mellitus without complication, without long-term current use of insulin (HCC) - Primary    A1c looks great today at 6.6.  Great improvement from last time at 6.9.  He is tolerating the medication well without side effects or problems and he is lost some weight just encouraged him to continue to  work on trying to get into  some type of regular exercise routine and continue to work on [employer] and portion control.      Relevant Orders   POCT glycosylated hemoglobin (Hb A1C) (Completed)     Nervous and Auditory   Acute swimmer's ear of left side   Relevant Medications   ofloxacin (FLOXIN OTIC) 0.3 % OTIC solution   Left sided swimmers ear - will tx with floxin drop.  Call if not better in one week.    Reminded to do the labs. Printed again today.    Meds ordered this encounter  Medications   ofloxacin (FLOXIN OTIC) 0.3 % OTIC solution    Sig: Place 5 drops into the left ear daily. X 5 day    Dispense:  5 mL    Refill:  0     Follow-up: Return in about 3 months (around 06/26/2021) for Diabetes follow-up.    Nani Gasser, MD

## 2021-03-24 NOTE — Patient Instructions (Signed)
Fantastic job on your A1c and getting your weight down!  It looks phenomenal.  Keep up the great work.  I cannot wait to see your numbers next time.

## 2021-03-27 DIAGNOSIS — E119 Type 2 diabetes mellitus without complications: Secondary | ICD-10-CM | POA: Diagnosis not present

## 2021-03-27 DIAGNOSIS — E782 Mixed hyperlipidemia: Secondary | ICD-10-CM | POA: Diagnosis not present

## 2021-03-28 LAB — CMP14+EGFR
ALT: 47 IU/L — ABNORMAL HIGH (ref 0–44)
AST: 22 IU/L (ref 0–40)
Albumin/Globulin Ratio: 2 (ref 1.2–2.2)
Albumin: 4.4 g/dL (ref 3.8–4.9)
Alkaline Phosphatase: 75 IU/L (ref 44–121)
BUN/Creatinine Ratio: 23 — ABNORMAL HIGH (ref 9–20)
BUN: 20 mg/dL (ref 6–24)
Bilirubin Total: 0.4 mg/dL (ref 0.0–1.2)
CO2: 25 mmol/L (ref 20–29)
Calcium: 9.3 mg/dL (ref 8.7–10.2)
Chloride: 103 mmol/L (ref 96–106)
Creatinine, Ser: 0.86 mg/dL (ref 0.76–1.27)
Globulin, Total: 2.2 g/dL (ref 1.5–4.5)
Glucose: 138 mg/dL — ABNORMAL HIGH (ref 65–99)
Potassium: 4.5 mmol/L (ref 3.5–5.2)
Sodium: 140 mmol/L (ref 134–144)
Total Protein: 6.6 g/dL (ref 6.0–8.5)
eGFR: 100 mL/min/{1.73_m2} (ref >59)

## 2021-03-28 LAB — LIPID PANEL
Chol/HDL Ratio: 4.2 ratio (ref 0.0–5.0)
Cholesterol, Total: 125 mg/dL (ref 100–199)
HDL: 30 mg/dL — ABNORMAL LOW (ref >39)
LDL Chol Calc (NIH): 67 mg/dL (ref 0–99)
Triglycerides: 159 mg/dL — ABNORMAL HIGH (ref 0–149)
VLDL Cholesterol Cal: 28 mg/dL (ref 5–40)

## 2021-03-29 NOTE — Progress Notes (Signed)
HI Robert Ward, your liver enzyme that is elevated is stable. YOur cholesterol looks much better overall!!! Great work!

## 2021-05-22 ENCOUNTER — Other Ambulatory Visit: Payer: Self-pay | Admitting: Family Medicine

## 2021-05-22 DIAGNOSIS — E119 Type 2 diabetes mellitus without complications: Secondary | ICD-10-CM

## 2021-05-24 DIAGNOSIS — E119 Type 2 diabetes mellitus without complications: Secondary | ICD-10-CM | POA: Diagnosis not present

## 2021-05-24 LAB — HM DIABETES EYE EXAM

## 2021-07-04 ENCOUNTER — Other Ambulatory Visit: Payer: Self-pay

## 2021-07-04 ENCOUNTER — Encounter: Payer: Self-pay | Admitting: Family Medicine

## 2021-07-04 ENCOUNTER — Ambulatory Visit: Payer: BC Managed Care – PPO | Admitting: Family Medicine

## 2021-07-04 VITALS — BP 132/71 | HR 61 | Temp 98.0°F | Ht 69.0 in | Wt 245.1 lb

## 2021-07-04 DIAGNOSIS — E119 Type 2 diabetes mellitus without complications: Secondary | ICD-10-CM

## 2021-07-04 DIAGNOSIS — H60332 Swimmer's ear, left ear: Secondary | ICD-10-CM | POA: Diagnosis not present

## 2021-07-04 DIAGNOSIS — Z23 Encounter for immunization: Secondary | ICD-10-CM

## 2021-07-04 LAB — POCT UA - MICROALBUMIN
Albumin/Creatinine Ratio, Urine, POC: <30
Creatinine, POC: 300 mg/dL
Microalbumin Ur, POC: 10 mg/L

## 2021-07-04 LAB — POCT GLYCOSYLATED HEMOGLOBIN (HGB A1C): HbA1c, POC (controlled diabetic range): 6.4 % (ref 0.0–7.0)

## 2021-07-04 MED ORDER — XIGDUO XR 10-1000 MG PO TB24: 1.0000 | ORAL_TABLET | Freq: Every day | ORAL | 1 refills | Status: DC

## 2021-07-04 NOTE — Assessment & Plan Note (Signed)
A1C of 6.5 today.  Well controlled. Continue current regimen. Follow up in  4 mo will be due for labs at that point.  Recent eye exam was performed at eye care center in [facility].

## 2021-07-04 NOTE — Progress Notes (Signed)
Established Patient Office Visit  Subjective:  Patient ID: Robert Ward, male    DOB: 1961/07/09  Age: 60 y.o. MRN: [id number]  CC:  Chief Complaint  Patient presents with   Diabetes    Follow up     HPI Robert Ward presents for   Diabetes - no hypoglycemic events. No wounds or sores that are not healing well. No increased thirst or urination. Checking glucose at home. Taking medications as prescribed without any side effects.  He also like me to recheck his left ear we have treated him for otitis externa back in September he just wanted to make sure that it felt clear he is not having any continued pain or irritation.  Past Medical History:  Diagnosis Date   Coronary artery disease    Diabetes mellitus without complication ([place])     Past Surgical History:  Procedure Laterality Date   TONSILLECTOMY  6    Family History  Problem Relation Age of Onset   COPD Mother        + smoker   Heart Problems Mother        Pacemaker.    COPD Father        + smoker, died 67   Heart attack Maternal Grandmother 43    Social History   Socioeconomic History   Marital status: Married    Spouse name: Not on file   Number of children: 1    Years of education: Not on file   Highest education level: Not on file  Occupational History   Occupation: Chief Financial Officer    Comment: [employer]  Tobacco Use   Smoking status: Never   Smokeless tobacco: Never  Vaping Use   Vaping Use: Never used  Substance and Sexual Activity   Alcohol use: Yes    Comment: occasional    Drug use: No   Sexual activity: Yes    Partners: Female    Comment: works as a Programmer, multimedia, not married, 1 child, doesn't regularly exercise.  Other Topics Concern   Not on file  Social History Narrative   1 caffeine drink per day. Some regular exercise.     Social Determinants of Health   Financial Resource Strain: Not on file  Food Insecurity: Not on file  Transportation Needs: Not on file  Physical  Activity: Not on file  Stress: Not on file  Social Connections: Not on file  Intimate Partner Violence: Not on file    Outpatient Medications Prior to Visit  Medication Sig Dispense Refill   atorvastatin (LIPITOR) 10 MG tablet Take 1 tablet (10 mg total) by mouth at bedtime. 90 tablet 3   GARLIC PO Take by mouth.     glucose blood test strip 1 each by Other route as needed for other. Use as instructed     Omega-3 Fatty Acids (FISH OIL PO) Take by mouth.     XIGDUO XR 04-999 MG TB24 TAKE 1 TABLET DAILY 90 tablet 0   ofloxacin (FLOXIN OTIC) 0.3 % OTIC solution Place 5 drops into the left ear daily. X 5 day 5 mL 0   No facility-administered medications prior to visit.    No Known Allergies  ROS Review of Systems    Objective:    Physical Exam Constitutional:      Appearance: Normal appearance. He is well-developed.  HENT:     Head: Normocephalic and atraumatic.     Right Ear: Tympanic membrane, ear canal and external ear normal.  Left Ear: Tympanic membrane and external ear normal.     Ears:     Comments: White moist debris in the left ear canal.   Cardiovascular:     Rate and Rhythm: Normal rate and regular rhythm.     Heart sounds: Normal heart sounds.  Pulmonary:     Effort: Pulmonary effort is normal.     Breath sounds: Normal breath sounds.  Skin:    General: Skin is warm and dry.  Neurological:     Mental Status: He is alert and oriented to person, place, and time. Mental status is at baseline.  Psychiatric:        Behavior: Behavior normal.    BP 132/71    Pulse 61    Temp 98 F (36.7 C)    Ht '5\' 9"'  (1.753 m)    Wt 245 lb 1.3 oz (111.2 kg)    SpO2 98%    BMI 36.19 kg/m  Wt Readings from Last 3 Encounters:  07/04/21 245 lb 1.3 oz (111.2 kg)  03/24/21 246 lb (111.6 kg)  12/02/20 254 lb (115.2 kg)     Health Maintenance Due  Topic Date Due   COVID-19 Vaccine (3 - Booster for Pfizer series) 12/23/2019    There are no preventive care reminders to  display for this patient.  Lab Results  Component Value Date   TSH 2.210 08/13/2018   Lab Results  Component Value Date   WBC 12.8 (H) 11/29/2011   HGB 15.5 11/29/2011   HCT 45.0 11/29/2011   MCV 87.2 11/29/2011   PLT 283 11/29/2011   Lab Results  Component Value Date   NA 140 03/27/2021   K 4.5 03/27/2021   CO2 25 03/27/2021   GLUCOSE 138 (H) 03/27/2021   BUN 20 03/27/2021   CREATININE 0.86 03/27/2021   BILITOT 0.4 03/27/2021   ALKPHOS 75 03/27/2021   AST 22 03/27/2021   ALT 47 (H) 03/27/2021   PROT 6.6 03/27/2021   ALBUMIN 4.4 03/27/2021   CALCIUM 9.3 03/27/2021   EGFR 100 03/27/2021   Lab Results  Component Value Date   CHOL 125 03/27/2021   Lab Results  Component Value Date   HDL 30 (L) 03/27/2021   Lab Results  Component Value Date   LDLCALC 67 03/27/2021   Lab Results  Component Value Date   TRIG 159 (H) 03/27/2021   Lab Results  Component Value Date   CHOLHDL 4.2 03/27/2021   Lab Results  Component Value Date   HGBA1C 6.4 07/04/2021      Assessment & Plan:   Problem List Items Addressed This Visit       Endocrine   Controlled type 2 diabetes mellitus without complication, without long-term current use of insulin (HCC) - Primary    A1C of 6.5 today.  Well controlled. Continue current regimen. Follow up in  4 mo will be due for labs at that point.  Recent eye exam was performed at eye care center in [facility].      Relevant Medications   Dapagliflozin-metFORMIN HCl ER (XIGDUO XR) 04-999 MG TB24   Other Relevant Orders   POCT HgB A1C (Completed)   POCT UA - Microalbumin (Completed)   Other Visit Diagnoses     Need for pneumococcal vaccination       Relevant Orders   Pneumococcal conjugate vaccine 20-valent (Prevnar 20) (Completed)   Acute swimmer's ear of left side           I do feel  he still has some residual otitis externa in the left ear canal but its not blocked and I can still see the TM which looks good.  Encouraged him  to restart his drops and treat for at least 5 days if he runs out then let us know and we can send over a refill.  Will call for eye report.    Meds ordered this encounter  Medications   Dapagliflozin-metFORMIN HCl ER (XIGDUO XR) 04-999 MG TB24    Sig: Take 1 tablet by mouth daily.    Dispense:  90 tablet    Refill:  1    Follow-up: Return in about 4 months (around 11/02/2021) for Diabetes follow-up.    Beatrice Lecher, MD

## 2021-08-10 ENCOUNTER — Other Ambulatory Visit: Payer: Self-pay | Admitting: Family Medicine

## 2021-08-10 DIAGNOSIS — E119 Type 2 diabetes mellitus without complications: Secondary | ICD-10-CM

## 2021-08-11 ENCOUNTER — Other Ambulatory Visit: Payer: Self-pay | Admitting: Family Medicine

## 2021-08-11 DIAGNOSIS — E119 Type 2 diabetes mellitus without complications: Secondary | ICD-10-CM

## 2021-11-09 ENCOUNTER — Encounter: Payer: Self-pay | Admitting: Family Medicine

## 2021-11-09 ENCOUNTER — Ambulatory Visit: Payer: BC Managed Care – PPO | Admitting: Family Medicine

## 2021-11-09 VITALS — BP 114/74 | HR 58 | Resp 18 | Ht 69.0 in | Wt 247.0 lb

## 2021-11-09 DIAGNOSIS — H9213 Otorrhea, bilateral: Secondary | ICD-10-CM

## 2021-11-09 DIAGNOSIS — E119 Type 2 diabetes mellitus without complications: Secondary | ICD-10-CM

## 2021-11-09 LAB — POCT GLYCOSYLATED HEMOGLOBIN (HGB A1C): Hemoglobin A1C: 6.1 % — AB (ref 4.0–5.6)

## 2021-11-09 MED ORDER — ATORVASTATIN CALCIUM 10 MG PO TABS: 10.0000 mg | ORAL_TABLET | Freq: Every day | ORAL | 3 refills | Status: DC

## 2021-11-09 MED ORDER — XIGDUO XR 10-1000 MG PO TB24: 1.0000 | ORAL_TABLET | Freq: Every day | ORAL | 3 refills | Status: DC

## 2021-11-09 NOTE — Progress Notes (Signed)
? ?Established Patient Office Visit ? ?Subjective   ?Patient ID: Robert Ward, male    DOB: 10-31-1960  Age: 61 y.o. MRN: [id number] ? ?Chief Complaint  ?Patient presents with  ? Diabetes  ?  Follow up   ? ? ?HPI ? ?Diabetes - no hypoglycemic events. No wounds or sores that are not healing well. No increased thirst or urination. Checking glucose at home. Taking medications as prescribed without any side effects.  He has been walking a little bit more.  Now that his son got a job truck driving he has been walking the dog a little bit more.  He does check his blood sugars occasionally in the mornings he notes that when he eats late the blood sugars are little higher in the mornings. ? ?So wanted me to check his ears today he has been feeling kind of a wet moisture warm sensation in the ears intermittently usually when he gets up in the morning. ? ? ? ?ROS ? ?  ?Objective:  ?  ? ?BP 114/74   Pulse (!) 58   Resp 18   Ht 5\' 9"  (1.753 m)   Wt 247 lb (112 kg)   SpO2 97%   BMI 36.48 kg/m?  ? ? ?Physical Exam ?Constitutional:   ?   Appearance: He is well-developed.  ?HENT:  ?   Head: Normocephalic and atraumatic.  ?   Right Ear: Tympanic membrane, ear canal and external ear normal.  ?   Left Ear: Tympanic membrane, ear canal and external ear normal.  ?Cardiovascular:  ?   Rate and Rhythm: Normal rate and regular rhythm.  ?   Heart sounds: Normal heart sounds.  ?Pulmonary:  ?   Effort: Pulmonary effort is normal.  ?   Breath sounds: Normal breath sounds.  ?Musculoskeletal:  ?   Cervical back: Neck supple. No tenderness.  ?Lymphadenopathy:  ?   Cervical: No cervical adenopathy.  ?Skin: ?   General: Skin is warm and dry.  ?Neurological:  ?   Mental Status: He is alert and oriented to person, place, and time.  ?Psychiatric:     ?   Behavior: Behavior normal.  ? ? ?Results for orders placed or performed in visit on 11/09/21  ?POCT HgB A1C  ?Result Value Ref Range  ? Hemoglobin A1C 6.1 (A) 4.0 - 5.6 %  ? HbA1c POC (<>  result, manual entry)    ? HbA1c, POC (prediabetic range)    ? HbA1c, POC (controlled diabetic range)    ? ? ? ? ?The ASCVD Risk score (Arnett DK, et al., 2019) failed to calculate for the following reasons: ?  The valid total cholesterol range is 130 to 320 mg/dL ? ?  ?Assessment & Plan:  ? ?Problem List Items Addressed This Visit   ? ?  ? Endocrine  ? Controlled type 2 diabetes mellitus without complication, without long-term current use of insulin ([place]) - Primary  ? Relevant Medications  ? atorvastatin (LIPITOR) 10 MG tablet  ? Dapagliflozin-metFORMIN HCl ER (XIGDUO XR) 04-999 MG TB24  ? Other Relevant Orders  ? POCT HgB A1C (Completed)  ? ?Other Visit Diagnoses   ? ? Otorrhea of both ears      ? ?  ? ? ?He has a DOT physical coming up. ? ?Ear drainage-ear exam looks good no worrisome findings.  I think he is getting a little moisture probably from decompression of the tympanic membrane in the mornings.   Call if develops any new  symptoms or pain. ? ?Return in about 4 months (around 03/12/2022) for Diabetes follow-up, Hypertension.  ? ? ?Beatrice Lecher, MD ? ?

## 2022-04-17 DIAGNOSIS — K648 Other hemorrhoids: Secondary | ICD-10-CM | POA: Diagnosis not present

## 2022-04-17 DIAGNOSIS — K5730 Diverticulosis of large intestine without perforation or abscess without bleeding: Secondary | ICD-10-CM | POA: Diagnosis not present

## 2022-04-17 DIAGNOSIS — Z1211 Encounter for screening for malignant neoplasm of colon: Secondary | ICD-10-CM | POA: Diagnosis not present

## 2022-04-17 LAB — HM COLONOSCOPY

## 2022-06-14 DIAGNOSIS — E119 Type 2 diabetes mellitus without complications: Secondary | ICD-10-CM | POA: Diagnosis not present

## 2022-06-15 NOTE — Progress Notes (Deleted)
Opened in error. T. Khaniyah Bezek, CMA 

## 2022-06-15 NOTE — Progress Notes (Unsigned)
Opened in error. T. Ison Wichmann, CMA 

## 2022-06-18 ENCOUNTER — Ambulatory Visit: Payer: BC Managed Care – PPO | Admitting: Family Medicine

## 2022-06-18 ENCOUNTER — Encounter: Payer: Self-pay | Admitting: Family Medicine

## 2022-06-18 VITALS — BP 122/58 | HR 59 | Wt 252.0 lb

## 2022-06-18 DIAGNOSIS — E119 Type 2 diabetes mellitus without complications: Secondary | ICD-10-CM

## 2022-06-18 DIAGNOSIS — Z125 Encounter for screening for malignant neoplasm of prostate: Secondary | ICD-10-CM | POA: Diagnosis not present

## 2022-06-18 DIAGNOSIS — E782 Mixed hyperlipidemia: Secondary | ICD-10-CM

## 2022-06-18 LAB — POCT GLYCOSYLATED HEMOGLOBIN (HGB A1C): Hemoglobin A1C: 7.5 % — AB (ref 4.0–5.6)

## 2022-06-18 NOTE — Assessment & Plan Note (Signed)
Continue atorvastatin 10 mg.  Due for repeat lipids today.  Last LDL was 67.  Hopefully at goal.

## 2022-06-18 NOTE — Assessment & Plan Note (Addendum)
Uncontrolled today.  A1c is up to 7.5.  We discussed options including adding a GLP-1 versus just really try to get back on track with diet and exercise.  He wants to try that first and then consider adding medication if not at goal in 3 months.  Will get updated lab work today including screening PSA.  Also encouraged him to work on increasing his exercise/activity level.

## 2022-06-18 NOTE — Progress Notes (Signed)
   Established Patient Office Visit  Subjective   Patient ID: Robert Ward, male    DOB: 09/01/1960  Age: 61 y.o. MRN: [id number]  Chief Complaint  Patient presents with   Diabetes    HPI  Diabetes - no hypoglycemic events. No wounds or sores that are not healing well. No increased thirst or urination. Checking glucose at home. Taking medications as prescribed without any side effects.  Due for yearly microalbumin.  Blood sugars have been fasting in the 170s.  He is doing well.  Recently went on vacation.  He did get his colonoscopy done.    ROS    Objective:     BP (!) 122/58   Pulse (!) 59   Wt 252 lb (114.3 kg)   SpO2 96%   BMI 37.21 kg/m    Physical Exam Constitutional:      Appearance: He is well-developed.  HENT:     Head: Normocephalic and atraumatic.  Cardiovascular:     Rate and Rhythm: Normal rate and regular rhythm.     Heart sounds: Normal heart sounds.  Pulmonary:     Effort: Pulmonary effort is normal.     Breath sounds: Normal breath sounds.  Skin:    General: Skin is warm and dry.  Neurological:     Mental Status: He is alert and oriented to person, place, and time.  Psychiatric:        Behavior: Behavior normal.      Results for orders placed or performed in visit on 06/18/22  POCT glycosylated hemoglobin (Hb A1C)  Result Value Ref Range   Hemoglobin A1C 7.5 (A) 4.0 - 5.6 %   HbA1c POC (<> result, manual entry)     HbA1c, POC (prediabetic range)     HbA1c, POC (controlled diabetic range)        The ASCVD Risk score (Arnett DK, et al., 2019) failed to calculate for the following reasons:   The valid total cholesterol range is 130 to 320 mg/dL    Assessment & Plan:   Problem List Items Addressed This Visit       Endocrine   Controlled type 2 diabetes mellitus without complication, without long-term current use of insulin ([facility]) - Primary    Uncontrolled today.  A1c is up to 7.5.  We discussed options including adding a GLP-1  versus just really try to get back on track with diet and exercise.  He wants to try that first and then consider adding medication if not at goal in 3 months.  Will get updated lab work today including screening PSA.  Also encouraged him to work on increasing his exercise/activity level.      Relevant Orders   POCT glycosylated hemoglobin (Hb A1C) (Completed)   Lipid panel   PSA   Microalbumin, urine   Comprehensive Metabolic Panel (CMET)     Other   Hyperlipidemia    Continue atorvastatin 10 mg.  Due for repeat lipids today.  Last LDL was 67.  Hopefully at goal.      Relevant Orders   Lipid panel   PSA   Microalbumin, urine   Comprehensive Metabolic Panel (CMET)   Other Visit Diagnoses     Screening PSA (prostate specific antigen)       Relevant Orders   PSA   Comprehensive Metabolic Panel (CMET)       Return in about 3 months (around 09/17/2022) for Diabetes follow-up.    Nani Gasser, MD

## 2022-06-19 LAB — LIPID PANEL
Chol/HDL Ratio: 5.5 ratio — ABNORMAL HIGH (ref 0.0–5.0)
Cholesterol, Total: 160 mg/dL (ref 100–199)
HDL: 29 mg/dL — ABNORMAL LOW (ref >39)
LDL Chol Calc (NIH): 88 mg/dL (ref 0–99)
Triglycerides: 258 mg/dL — ABNORMAL HIGH (ref 0–149)
VLDL Cholesterol Cal: 43 mg/dL — ABNORMAL HIGH (ref 5–40)

## 2022-06-19 LAB — COMPREHENSIVE METABOLIC PANEL
ALT: 36 IU/L (ref 0–44)
AST: 18 IU/L (ref 0–40)
Albumin/Globulin Ratio: 1.9 (ref 1.2–2.2)
Albumin: 4.5 g/dL (ref 3.8–4.9)
Alkaline Phosphatase: 73 IU/L (ref 44–121)
BUN/Creatinine Ratio: 19 (ref 10–24)
BUN: 18 mg/dL (ref 8–27)
Bilirubin Total: 0.6 mg/dL (ref 0.0–1.2)
CO2: 24 mmol/L (ref 20–29)
Calcium: 9.4 mg/dL (ref 8.6–10.2)
Chloride: 102 mmol/L (ref 96–106)
Creatinine, Ser: 0.96 mg/dL (ref 0.76–1.27)
Globulin, Total: 2.4 g/dL (ref 1.5–4.5)
Glucose: 136 mg/dL — ABNORMAL HIGH (ref 70–99)
Potassium: 4.8 mmol/L (ref 3.5–5.2)
Sodium: 138 mmol/L (ref 134–144)
Total Protein: 6.9 g/dL (ref 6.0–8.5)
eGFR: 90 mL/min/{1.73_m2} (ref >59)

## 2022-06-19 LAB — PSA: Prostate Specific Ag, Serum: 0.3 ng/mL (ref 0.0–4.0)

## 2022-06-19 LAB — MICROALBUMIN, URINE: Microalbumin, Urine: 3.7 ug/mL

## 2022-06-19 NOTE — Progress Notes (Signed)
Hi Robert Ward, liver enzymes are back down to normal which is awesome.  To work on [employer] and increased activity it definitely helps.  Your total cholesterol and LDL look good.  But they are up a little bit compared to last year and your triglycerides are up compared to last year or so again just continue to work on those healthy lifestyle changes.  Test is normal.  The urine protein test is still pending.  Let us know when you get your eye exam updated and we will get your chart updated as well.

## 2022-06-26 ENCOUNTER — Encounter: Payer: Self-pay | Admitting: Family Medicine

## 2022-06-26 DIAGNOSIS — E119 Type 2 diabetes mellitus without complications: Secondary | ICD-10-CM

## 2022-06-26 MED ORDER — XIGDUO XR 10-1000 MG PO TB24: 1.0000 | ORAL_TABLET | Freq: Every day | ORAL | 3 refills | Status: DC

## 2022-07-06 ENCOUNTER — Telehealth: Payer: Self-pay

## 2022-07-06 NOTE — Telephone Encounter (Signed)
Initiated Prior authorization [id number] XR 10-1000MG  er tablets  Via: Covermymeds Case/Key:[id number] Status: n/a as of 07/06/22 Reason:The member benefit does not include pharmacy drug coverage. Eligible drugs may be covered under the medical benefit. Notified Pt via: Mychart

## 2022-07-10 MED ORDER — XIGDUO XR 10-1000 MG PO TB24: 1.0000 | ORAL_TABLET | Freq: Every day | ORAL | 1 refills | Status: DC

## 2022-07-10 MED ORDER — XIGDUO XR 10-1000 MG PO TB24: 1.0000 | ORAL_TABLET | Freq: Every day | ORAL | 3 refills | Status: DC

## 2022-07-10 NOTE — Telephone Encounter (Signed)
FYI -   New m/o pharmacy updated in patient's chart.

## 2022-07-10 NOTE — Telephone Encounter (Signed)
Meds ordered this encounter  Medications   DISCONTD: Dapagliflozin Pro-metFORMIN ER (XIGDUO XR) 04-999 MG TB24    Sig: Take 1 tablet by mouth daily.    Dispense:  90 tablet    Refill:  3   DISCONTD: Dapagliflozin Pro-metFORMIN ER (XIGDUO XR) 04-999 MG TB24    Sig: Take 1 tablet by mouth daily.    Dispense:  90 tablet    Refill:  3   Dapagliflozin Pro-metFORMIN ER (XIGDUO XR) 04-999 MG TB24    Sig: Take 1 tablet by mouth daily.    Dispense:  90 tablet    Refill:  1

## 2022-07-10 NOTE — Telephone Encounter (Signed)
If he is changing insurances anyway then I would say lets just try to send the same 1 and run it through the new insurance and see what they recommend.  Is he still using [facility]?  Or is it a different mail order with the new insurance plan?  Meds ordered this encounter  Medications   DISCONTD: Dapagliflozin Pro-metFORMIN ER (XIGDUO XR) 04-999 MG TB24    Sig: Take 1 tablet by mouth daily.    Dispense:  90 tablet    Refill:  3   Dapagliflozin Pro-metFORMIN ER (XIGDUO XR) 04-999 MG TB24    Sig: Take 1 tablet by mouth daily.    Dispense:  90 tablet    Refill:  3

## 2022-09-10 ENCOUNTER — Encounter: Payer: Self-pay | Admitting: Family Medicine

## 2022-09-10 DIAGNOSIS — E119 Type 2 diabetes mellitus without complications: Secondary | ICD-10-CM

## 2022-09-11 MED ORDER — ATORVASTATIN CALCIUM 10 MG PO TABS: 10.0000 mg | ORAL_TABLET | Freq: Every day | ORAL | 3 refills | Status: DC

## 2022-10-04 ENCOUNTER — Other Ambulatory Visit: Payer: Self-pay | Admitting: Family Medicine

## 2022-10-04 DIAGNOSIS — E119 Type 2 diabetes mellitus without complications: Secondary | ICD-10-CM

## 2022-10-08 ENCOUNTER — Ambulatory Visit: Payer: 59 | Admitting: Family Medicine

## 2022-10-08 ENCOUNTER — Encounter: Payer: Self-pay | Admitting: Family Medicine

## 2022-10-08 VITALS — BP 108/64 | HR 57 | Resp 18 | Ht 69.0 in | Wt 242.4 lb

## 2022-10-08 DIAGNOSIS — E782 Mixed hyperlipidemia: Secondary | ICD-10-CM | POA: Diagnosis not present

## 2022-10-08 DIAGNOSIS — E119 Type 2 diabetes mellitus without complications: Secondary | ICD-10-CM | POA: Diagnosis not present

## 2022-10-08 DIAGNOSIS — Z6835 Body mass index (BMI) 35.0-35.9, adult: Secondary | ICD-10-CM

## 2022-10-08 LAB — POCT GLYCOSYLATED HEMOGLOBIN (HGB A1C): Hemoglobin A1C: 6.3 % — AB (ref 4.0–5.6)

## 2022-10-08 MED ORDER — DAPAGLIFLOZIN PRO-METFORMIN ER 10-1000 MG PO TB24
1.0000 | ORAL_TABLET | Freq: Every day | ORAL | 3 refills | Status: DC
Start: 2022-10-08 — End: 2023-10-07

## 2022-10-08 NOTE — Progress Notes (Signed)
Established Patient Office Visit  Subjective   Patient ID: Robert Ward, male    DOB: Jul 16, 1960  Age: 62 y.o. MRN: [id number]  Chief Complaint  Patient presents with   Follow-up    HPI  Diabetes - no hypoglycemic events. No wounds or sores that are not healing well. No increased thirst or urination. Checking glucose at home. Taking medications as prescribed without any side effects.  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  He has been walking a lot more active and is actually been able to drop about 10 pounds since I last saw him.  Hyperlipidemia - tolerating stating well with no myalgias or significant side effects.  Lab Results  Component Value Date   CHOL 160 06/18/2022   HDL 29 (L) 06/18/2022   LDLCALC 88 06/18/2022   TRIG 258 (H) 06/18/2022   CHOLHDL 5.5 (H) 06/18/2022        ROS    Objective:     BP 108/64   Pulse (!) 57   Resp 18   Ht 5\' 9"  (1.753 m)   Wt 242 lb 6.4 oz (110 kg)   SpO2 96%   BMI 35.80 kg/m    Physical Exam Constitutional:      Appearance: He is well-developed.  HENT:     Head: Normocephalic and atraumatic.  Cardiovascular:     Rate and Rhythm: Normal rate and regular rhythm.     Heart sounds: Normal heart sounds.  Pulmonary:     Effort: Pulmonary effort is normal.     Breath sounds: Normal breath sounds.  Skin:    General: Skin is warm and dry.  Neurological:     Mental Status: He is alert and oriented to person, place, and time.  Psychiatric:        Behavior: Behavior normal.     Results for orders placed or performed in visit on 10/08/22  POCT HgB A1C  Result Value Ref Range   Hemoglobin A1C 6.3 (A) 4.0 - 5.6 %   HbA1c POC (<> result, manual entry)     HbA1c, POC (prediabetic range)     HbA1c, POC (controlled diabetic range)    HM COLONOSCOPY  Result Value Ref Range   HM  Colonoscopy See Report (in chart) See Report (in chart), Patient Reported      The 10-year ASCVD risk score (Arnett DK, et al., 2019) is: 14.5%    Assessment & Plan:   Problem List Items Addressed This Visit       Endocrine   Controlled type 2 diabetes mellitus without complication, without long-term current use of insulin - Primary     A1c looks phenomenal today at 6.3 is not a great job in bringing that back down.  Continue work on [employer] and regular exercise.  His weight is also down 10 pounds has been more active and walking at work since changing jobs.  Blood pressure also looks phenomenal.  Follow-up in 4 months.      Relevant Medications   Dapagliflozin Pro-metFORMIN ER (XIGDUO XR) 04-999 MG TB24   Other Relevant Orders   POCT HgB A1C (Completed)     Other   Hyperlipidemia    Lipids were just done in November and LDL was at goal at 91.  Continue current regimen he does have enough refills currently.  BMI 35.0-35.9,adult    Is really doing great with weight loss.  Continue current regimen.      Discussed need for colon cancer screening.  Return in about 4 months (around 02/07/2023) for Diabetes follow-up.    Beatrice Lecher, MD

## 2022-10-08 NOTE — Assessment & Plan Note (Signed)
Lipids were just done in November and LDL was at goal at 91.  Continue current regimen he does have enough refills currently.

## 2022-10-08 NOTE — Assessment & Plan Note (Signed)
A1c looks phenomenal today at 6.3 is not a great job in bringing that back down.  Continue work on [employer] and regular exercise.  His weight is also down 10 pounds has been more active and walking at work since changing jobs.  Blood pressure also looks phenomenal.  Follow-up in 4 months.

## 2022-10-08 NOTE — Assessment & Plan Note (Signed)
Is really doing great with weight loss.  Continue current regimen.

## 2022-12-03 IMAGING — DX DG CHEST 2V
2 series · 2 of 2 positions shown · non-contrast
Comparison: None.

CLINICAL DATA: 59-year-old male with cough.

EXAM:
CHEST - 2 VIEW

[chest pa]
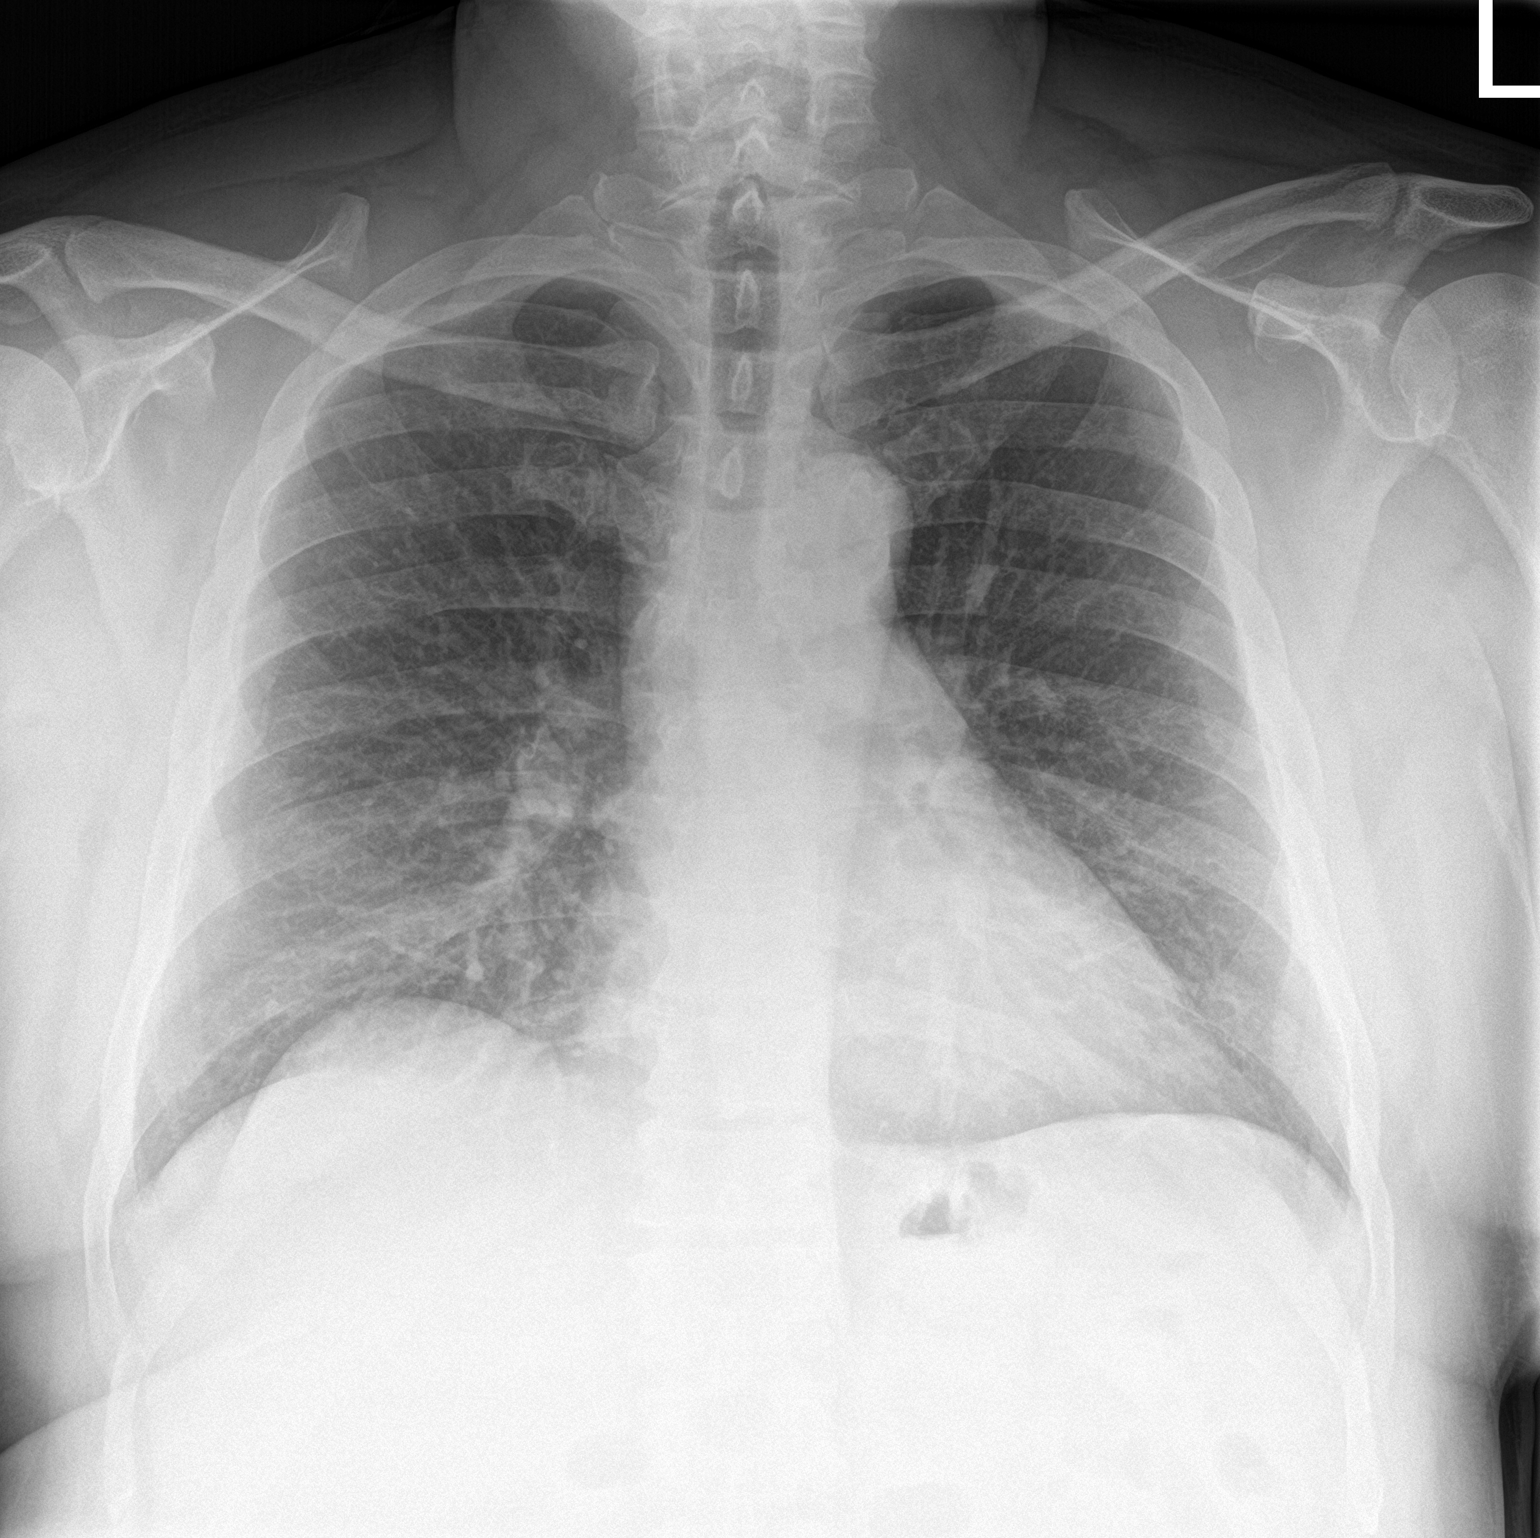

[chest lat]
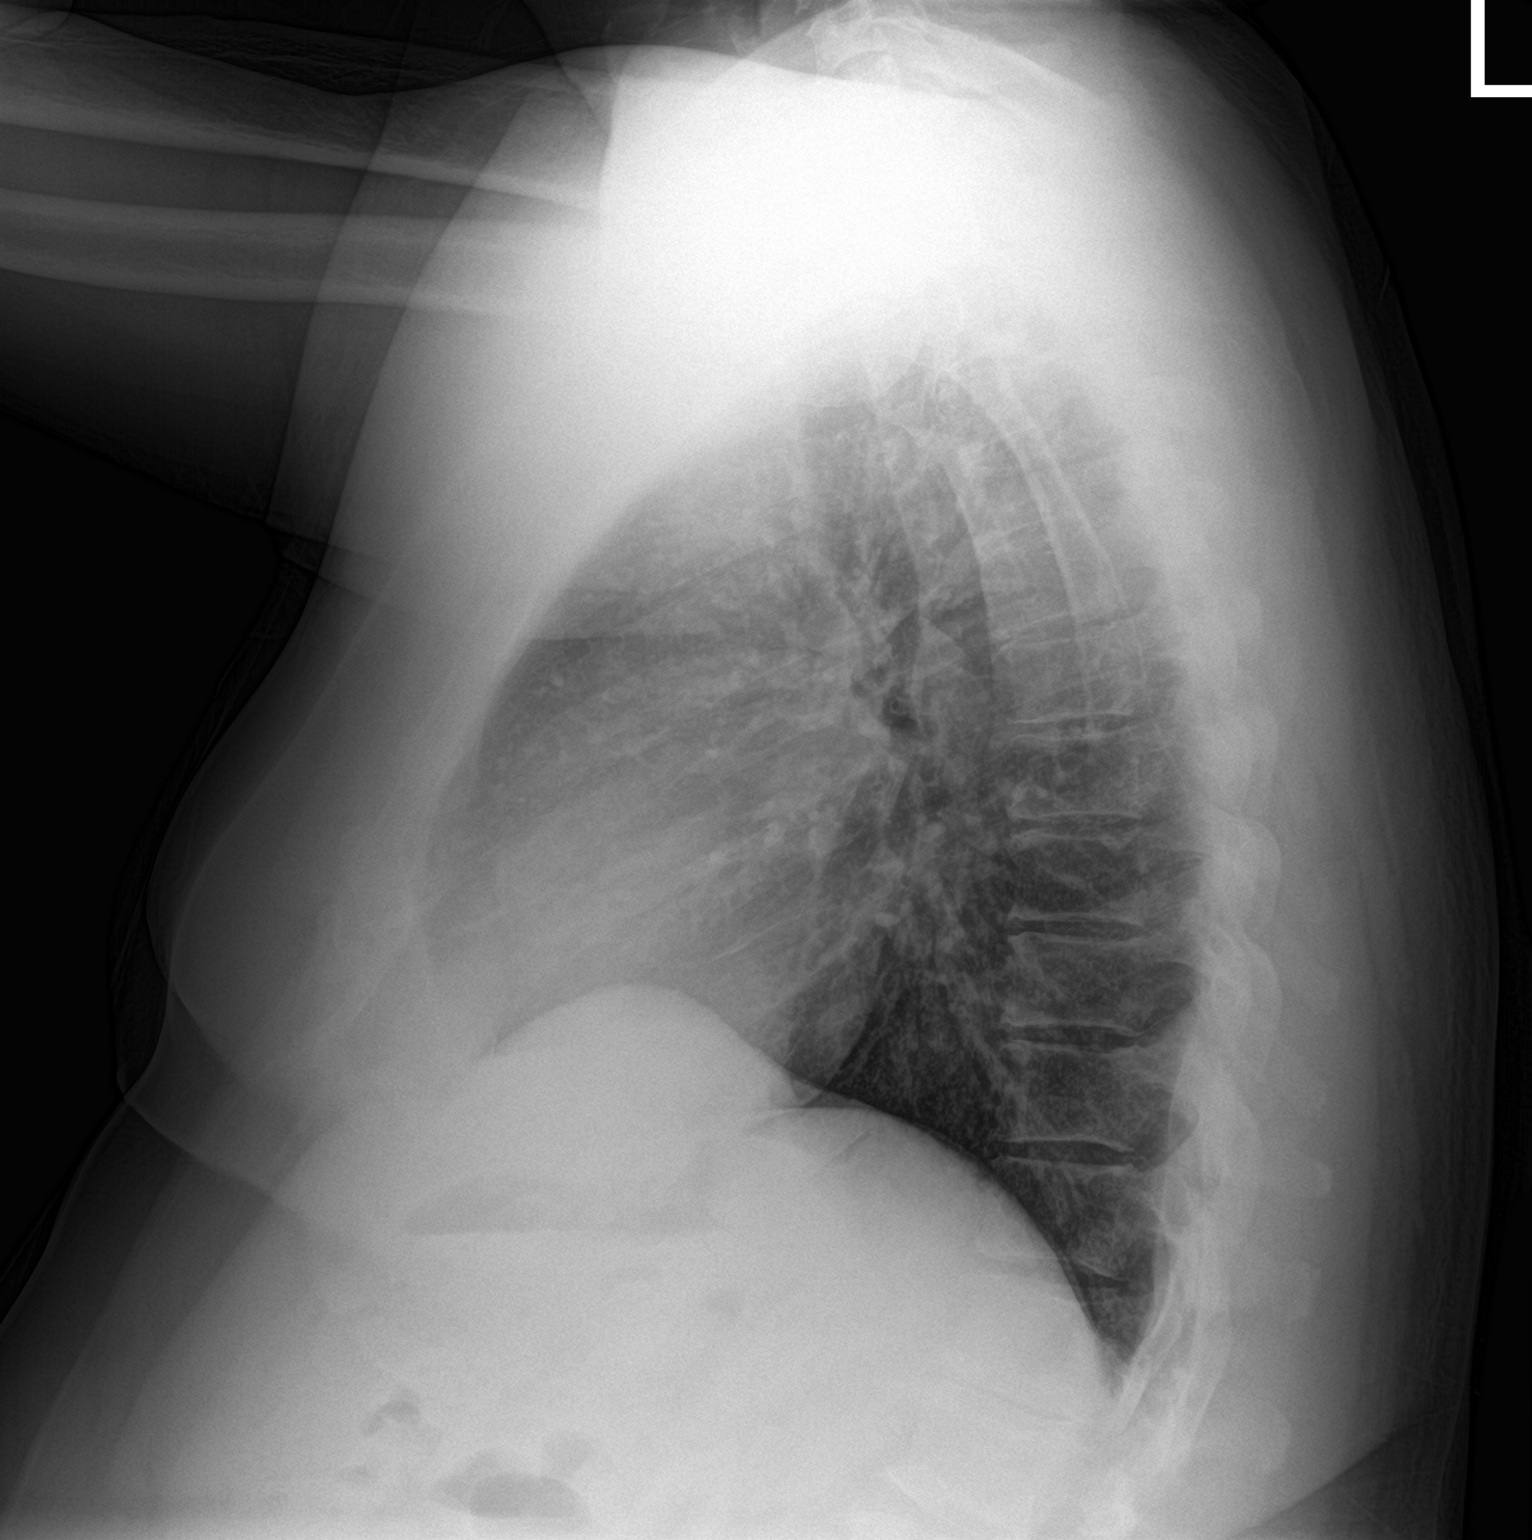

[2 of 2 positions shown; findings below may reference images not displayed]

FINDINGS: No focal consolidation, pleural effusion, pneumothorax. The cardiac
silhouette is within limits. No acute osseous pathology.
IMPRESSION: No active cardiopulmonary disease.

## 2023-05-13 ENCOUNTER — Ambulatory Visit (INDEPENDENT_AMBULATORY_CARE_PROVIDER_SITE_OTHER): Payer: BC Managed Care – PPO | Admitting: Family Medicine

## 2023-05-13 ENCOUNTER — Encounter: Payer: Self-pay | Admitting: Family Medicine

## 2023-05-13 VITALS — BP 113/56 | HR 57 | Ht 69.0 in | Wt 224.0 lb

## 2023-05-13 DIAGNOSIS — Z7984 Long term (current) use of oral hypoglycemic drugs: Secondary | ICD-10-CM | POA: Diagnosis not present

## 2023-05-13 DIAGNOSIS — E119 Type 2 diabetes mellitus without complications: Secondary | ICD-10-CM

## 2023-05-13 LAB — POCT GLYCOSYLATED HEMOGLOBIN (HGB A1C): Hemoglobin A1C: 5.9 % — AB (ref 4.0–5.6)

## 2023-05-13 NOTE — Progress Notes (Signed)
Established Patient Office Visit  Subjective   Patient ID: Robert Ward, male    DOB: 1961/01/24  Age: 62 y.o. MRN: [id number]   HPI   Diabetes - no hypoglycemic events. No wounds or sores that are not healing well. No increased thirst or urination. Checking glucose at home. Taking medications as prescribed without any side effects. Does not need any refills at this time. Fasting blood sugars are running in the 120s with some occasional spikes. He is tracking his blood sugar on an app.   He states that he is getting about 10,000 steps a day at work. His wife and him started on weight watchers in August. He is requesting ear irrigation today. Otherwise no problems today.  ROS See HPI   Objective:     BP (!) 113/56   Pulse (!) 57   Ht 5\' 9"  (1.753 m)   Wt 224 lb (101.6 kg)   SpO2 98%   BMI 33.08 kg/m  BP Readings from Last 3 Encounters:  05/13/23 (!) 113/56  10/08/22 108/64  06/18/22 (!) 122/58   Wt Readings from Last 3 Encounters:  05/13/23 224 lb (101.6 kg)  10/08/22 242 lb 6.4 oz (110 kg)  06/18/22 252 lb (114.3 kg)      Physical Exam Vitals and nursing note reviewed.  Constitutional:      Appearance: Normal appearance.  HENT:     Head: Normocephalic and atraumatic.     Ears:     Comments: Cerumen present bilaterally. L>R Eyes:     Conjunctiva/sclera: Conjunctivae normal.  Cardiovascular:     Rate and Rhythm: Normal rate and regular rhythm.  Pulmonary:     Effort: Pulmonary effort is normal.     Breath sounds: Normal breath sounds.  Musculoskeletal:        General: Normal range of motion.  Skin:    General: Skin is warm and dry.  Neurological:     General: No focal deficit present.     Mental Status: He is alert and oriented to person, place, and time.  Psychiatric:        Mood and Affect: Mood normal.    .. Results for orders placed or performed in visit on 05/13/23  CMP14+EGFR  Result Value Ref Range   Glucose 81 70 - 99 mg/dL   BUN 24 8 -  27 mg/dL   Creatinine, Ser 4.09 0.76 - 1.27 mg/dL   eGFR 98 >81 XB/JYN/8.29   BUN/Creatinine Ratio 28 (H) 10 - 24   Sodium 143 134 - 144 mmol/L   Potassium 4.4 3.5 - 5.2 mmol/L   Chloride 104 96 - 106 mmol/L   CO2 25 20 - 29 mmol/L   Calcium 9.3 8.6 - 10.2 mg/dL   Total Protein 6.5 6.0 - 8.5 g/dL   Albumin 4.4 3.9 - 4.9 g/dL   Globulin, Total 2.1 1.5 - 4.5 g/dL   Bilirubin Total 0.4 0.0 - 1.2 mg/dL   Alkaline Phosphatase 68 44 - 121 IU/L   AST 19 0 - 40 IU/L   ALT 18 0 - 44 IU/L  Urine Microalbumin w/creat. ratio  Result Value Ref Range   Creatinine, Urine WILL FOLLOW    Microalbumin, Urine WILL FOLLOW    Microalb/Creat Ratio WILL FOLLOW   POCT HgB A1C  Result Value Ref Range   Hemoglobin A1C 5.9 (A) 4.0 - 5.6 %   HbA1c POC (<> result, manual entry)     HbA1c, POC (prediabetic range)  HbA1c, POC (controlled diabetic range)        The 10-year ASCVD risk score (Arnett DK, et al., 2019) is: 16.8%    Assessment & Plan:  Marland KitchenMarland KitchenIndigo was seen today for diabetes.  Diagnoses and all orders for this visit:  Controlled type 2 diabetes mellitus without complication, without long-term current use of insulin ([facility]) -     CMP14+EGFR -     Urine Microalbumin w/creat. ratio -     POCT HgB A1C    Problem List Items Addressed This Visit       Endocrine   Controlled type 2 diabetes mellitus without complication, without long-term current use of insulin ([facility]) - Primary   Relevant Orders   CMP14+EGFR (Completed)   Urine Microalbumin w/creat. ratio (Completed)   POCT HgB A1C (Completed)   A1C down today at 5.9%. Patient interested in going back down to just metformin. Pt has plenty of xigduo left so he will continue that until he is out and then send a Mychart message requesting the metformin.  Will get CMP and urine microalbumin today to check kidney function. Eye exam scheduled in December.  Ear irrigation performed today. Follow up in 69m to monitor A1C with medication change.     Nani Gasser, MD

## 2023-05-14 LAB — MICROALBUMIN / CREATININE URINE RATIO
Creatinine, Urine: 144.8 mg/dL
Microalb/Creat Ratio: 4 mg/g{creat} (ref 0–29)
Microalbumin, Urine: 5.3 ug/mL

## 2023-05-14 LAB — CMP14+EGFR
ALT: 18 [IU]/L (ref 0–44)
AST: 19 [IU]/L (ref 0–40)
Albumin: 4.4 g/dL (ref 3.9–4.9)
Alkaline Phosphatase: 68 [IU]/L (ref 44–121)
BUN/Creatinine Ratio: 28 — ABNORMAL HIGH (ref 10–24)
BUN: 24 mg/dL (ref 8–27)
Bilirubin Total: 0.4 mg/dL (ref 0.0–1.2)
CO2: 25 mmol/L (ref 20–29)
Calcium: 9.3 mg/dL (ref 8.6–10.2)
Chloride: 104 mmol/L (ref 96–106)
Creatinine, Ser: 0.87 mg/dL (ref 0.76–1.27)
Globulin, Total: 2.1 g/dL (ref 1.5–4.5)
Glucose: 81 mg/dL (ref 70–99)
Potassium: 4.4 mmol/L (ref 3.5–5.2)
Sodium: 143 mmol/L (ref 134–144)
Total Protein: 6.5 g/dL (ref 6.0–8.5)
eGFR: 98 mL/min/{1.73_m2} (ref >59)

## 2023-05-14 NOTE — Progress Notes (Signed)
Agree with above 

## 2023-05-14 NOTE — Progress Notes (Signed)
Hi Stan, metabolic panel overall looks good.  A1c is 5.9 so looking better compared to 7 months ago.  Great work in bringing that down.  Urine sample is still pending.

## 2023-05-15 NOTE — Progress Notes (Signed)
No excess protein in the urine, which is good.

## 2023-06-20 LAB — HM DIABETES EYE EXAM

## 2023-07-04 ENCOUNTER — Encounter: Payer: Self-pay | Admitting: Family Medicine

## 2023-07-04 ENCOUNTER — Ambulatory Visit (INDEPENDENT_AMBULATORY_CARE_PROVIDER_SITE_OTHER): Payer: BC Managed Care – PPO | Admitting: Family Medicine

## 2023-07-04 VITALS — BP 115/71 | HR 64 | Ht 69.0 in | Wt 220.0 lb

## 2023-07-04 DIAGNOSIS — E119 Type 2 diabetes mellitus without complications: Secondary | ICD-10-CM | POA: Diagnosis not present

## 2023-07-04 DIAGNOSIS — Z7984 Long term (current) use of oral hypoglycemic drugs: Secondary | ICD-10-CM | POA: Diagnosis not present

## 2023-07-04 DIAGNOSIS — R197 Diarrhea, unspecified: Secondary | ICD-10-CM | POA: Diagnosis not present

## 2023-07-04 MED ORDER — BLOOD GLUCOSE TEST VI STRP
1.0000 | ORAL_STRIP | Freq: Every day | 99 refills | Status: DC | PRN
Start: 2023-07-04 — End: 2023-11-25

## 2023-07-04 NOTE — Assessment & Plan Note (Signed)
New prescription sent for strips.  Encouraged him to follow-up in February for regular appointment for next A1c will be due for foot exam and eye exam.

## 2023-07-04 NOTE — Progress Notes (Signed)
   Acute Office Visit  Subjective:     Patient ID: Robert Ward, male    DOB: 10/28/1960, 62 y.o.   MRN: [id number]  Chief Complaint  Patient presents with   Diarrhea    HPI Patient is in today for Pt reports that on Christmas Eve he and his wife went to [facility] in [place] and was told that they couldn't serve them water because it wasn't clean. He did have their tea. He stated that he started not feeling well the following day and his wife also wasn't feeling to well however her sxs are not as severe has his sxs.    He is having watery diarrhea x2days. He said that last night he felt somewhat nauseated and experienced some chills. He hasn't taken any OTC medications for his sxs  No fever. No vomiting.  Noticed trace blood with wiping yesterday .   ROS      Objective:    BP 115/71   Pulse 64   Ht 5\' 9"  (1.753 m)   Wt 220 lb (99.8 kg)   SpO2 97%   BMI 32.49 kg/m    Physical Exam Vitals and nursing note reviewed.  Constitutional:      Appearance: Normal appearance.  HENT:     Head: Normocephalic and atraumatic.  Eyes:     Conjunctiva/sclera: Conjunctivae normal.  Cardiovascular:     Rate and Rhythm: Normal rate and regular rhythm.  Pulmonary:     Effort: Pulmonary effort is normal.     Breath sounds: Normal breath sounds.  Abdominal:     General: Bowel sounds are normal.     Palpations: Abdomen is soft.     Tenderness: There is abdominal tenderness.     Comments: Generalized tenderness  Skin:    General: Skin is warm and dry.  Neurological:     Mental Status: He is alert.  Psychiatric:        Mood and Affect: Mood normal.     No results found for any visits on 07/04/23.      Assessment & Plan:   Problem List Items Addressed This Visit       Endocrine   Controlled type 2 diabetes mellitus without complication, without long-term current use of insulin ([facility])   New prescription sent for strips.  Encouraged him to follow-up in February for  regular appointment for next A1c will be due for foot exam and eye exam.      Relevant Medications   Glucose Blood (BLOOD GLUCOSE TEST STRIPS) STRP   Other Visit Diagnoses       Diarrhea of presumed infectious origin    -  Primary   Relevant Orders   GI Profile, Stool, PCR       Diarrhea-since symptoms started within 1 to 2 hours after eating out we will go ahead and do a stool culture today.  Recommend continue to hydrate and stay nourished.  He has not had any fever or bloody stools.  Call if any new symptoms develop.  Try to avoid Imodium if at all possible.  Meds ordered this encounter  Medications   Glucose Blood (BLOOD GLUCOSE TEST STRIPS) STRP    Sig: 1 each by In Vitro route daily as needed. Contour Nexgen strips    Dispense:  100 strip    Refill:  PRN    No follow-ups on file.  Nani Gasser, MD

## 2023-07-04 NOTE — Progress Notes (Signed)
Pt reports that on Christmas eve he and his wife went to [facility] in [place] and was told that they couldn't serve them water because it wasn't clean. He did have their tea. He stated that he started not feeling well the following day and his wife also wasn't feeling to well however her sxs are not as severe has his sxs.   He is having watery diarrhea x2days. He said that last night he felt somewhat nauseated and experienced some chills. He hasn't taken any OTC medications for his sxs.

## 2023-07-05 LAB — GI PROFILE, STOOL, PCR

## 2023-07-06 ENCOUNTER — Ambulatory Visit
Admission: RE | Admit: 2023-07-06 | Discharge: 2023-07-06 | Disposition: A | Discharge status: Home / Self Care | Payer: BC Managed Care – PPO | Source: Ambulatory Visit | Attending: Family Medicine | Admitting: Family Medicine

## 2023-07-06 ENCOUNTER — Other Ambulatory Visit: Payer: Self-pay

## 2023-07-06 VITALS — BP 144/79 | HR 68 | Temp 98.5°F | Resp 17

## 2023-07-06 DIAGNOSIS — R197 Diarrhea, unspecified: Secondary | ICD-10-CM

## 2023-07-06 MED ORDER — DICYCLOMINE HCL 20 MG PO TABS: 20.0000 mg | ORAL_TABLET | Freq: Three times a day (TID) | ORAL | 0 refills | Status: DC | PRN

## 2023-07-06 NOTE — ED Provider Notes (Signed)
[facility]    CSN: [id number] Arrival date & time: 07/06/23  1404      History   Chief Complaint Chief Complaint  Patient presents with   Diarrhea    HPI Robert Ward is a 62 y.o. male.   HPI  Past Medical History:  Diagnosis Date   Coronary artery disease    Diabetes mellitus without complication [facility])     Patient Active Problem List   Diagnosis Date Noted   BMI 35.0-35.9,adult 04/13/2019   Bilateral hearing loss 04/13/2019   Controlled type 2 diabetes mellitus without complication, without long-term current use of insulin (HCC) 04/04/2018   External hemorrhoid 12/31/2013   Impingement syndrome of left shoulder 09/08/2012   Obesity 01/11/2012   Hyperlipidemia 03/23/2011    Past Surgical History:  Procedure Laterality Date   TONSILLECTOMY  5       Home Medications    Prior to Admission medications   Medication Sig Start Date End Date Taking? Authorizing Provider  atorvastatin (LIPITOR) 10 MG tablet Take 1 tablet (10 mg total) by mouth at bedtime. 09/11/22   Agapito Games, MD  Dapagliflozin Pro-metFORMIN ER (XIGDUO XR) 04-999 MG TB24 Take 1 tablet by mouth daily. 10/08/22   Agapito Games, MD  dicyclomine (BENTYL) 20 MG tablet Take 1 tablet (20 mg total) by mouth 3 (three) times daily as needed for spasms. 07/06/23  Yes [name], [name]  Glucose Blood (BLOOD GLUCOSE TEST STRIPS) STRP 1 each by In Vitro route daily as needed. Contour Nexgen strips 07/04/23 10/12/23  Agapito Games, MD    Family History Family History  Problem Relation Age of Onset   COPD Mother        + smoker   Heart Problems Mother        Pacemaker.    COPD Father        + smoker, died 10   Heart attack Maternal Grandmother 6    Social History Social History   Tobacco Use   Smoking status: Never   Smokeless tobacco: Never  Vaping Use   Vaping status: Never Used  Substance Use Topics   Alcohol use: Never    Comment: occasional    Drug  use: Never     Allergies   Patient has no known allergies.   Review of Systems Review of Systems  Gastrointestinal:  Positive for diarrhea.  All other systems reviewed and are negative.    Physical Exam Triage Vital Signs ED Triage Vitals  Encounter Vitals Group     BP      Systolic BP Percentile      Diastolic BP Percentile      Pulse      Resp      Temp      Temp src      SpO2      Weight      Height      Head Circumference      Peak Flow      Pain Score      Pain Loc      Pain Education      Exclude from Growth Chart    No data found.  Updated Vital Signs BP (!) 144/79 (BP Location: Right Arm)   Pulse 68   Temp 98.5 F (36.9 C) (Oral)   Resp 17   SpO2 97%   Visual Acuity Right Eye Distance:   Left Eye Distance:   Bilateral Distance:    Right Eye  Near:   Left Eye Near:    Bilateral Near:     Physical Exam Vitals and nursing note reviewed.  Constitutional:      Appearance: Normal appearance. He is normal weight.  HENT:     Head: Normocephalic and atraumatic.     Mouth/Throat:     Mouth: Mucous membranes are moist.     Pharynx: Oropharynx is clear.  Eyes:     Extraocular Movements: Extraocular movements intact.     Conjunctiva/sclera: Conjunctivae normal.     Pupils: Pupils are equal, round, and reactive to light.  Cardiovascular:     Rate and Rhythm: Normal rate and regular rhythm.     Pulses: Normal pulses.     Heart sounds: Normal heart sounds.  Pulmonary:     Effort: Pulmonary effort is normal.     Breath sounds: Normal breath sounds. No wheezing, rhonchi or rales.  Musculoskeletal:        General: Normal range of motion.     Cervical back: Normal range of motion and neck supple.  Skin:    General: Skin is warm and dry.  Neurological:     General: No focal deficit present.     Mental Status: He is alert and oriented to person, place, and time. Mental status is at baseline.  Psychiatric:        Mood and Affect: Mood normal.         Behavior: Behavior normal.      UC Treatments / Results  Labs (all labs ordered are listed, but only abnormal results are displayed) Labs Reviewed - No data to display  EKG   Radiology No results found.  Procedures Procedures (including critical care time)  Medications Ordered in UC Medications - No data to display  Initial Impression / Assessment and Plan / UC Course  I have reviewed the triage vital signs and the nursing notes.  Pertinent labs & imaging results that were available during my care of the patient were reviewed by me and considered in my medical decision making (see chart for details).     MDM: 1.  Diarrhea, unspecified type-advised patient GI profile, stool PCR was negative.  Rx'd Bentyl 20 mg tablet: Take 1 tablet 3 times daily, as needed for diarrhea. Advised patient GI profile 6, stool PCR was completely negative.  Advised may take Bentyl daily, as needed for diarrhea and to help formed stool.  Encouraged to increase daily water intake to 64 ounces per day while taking his medication.  Advised if symptoms worsen and/or unresolved please follow-up with PCP or here for further evaluation. Final Clinical Impressions(s) / UC Diagnoses   Final diagnoses:  Diarrhea, unspecified type     Discharge Instructions      Advised patient GI profile 6, stool PCR was completely negative.  Advised may take Bentyl daily, as needed for diarrhea and to help formed stool.  Encouraged to increase daily water intake to 64 ounces per day while taking his medication.  Advised if symptoms worsen and/or unresolved please follow-up with PCP or here for further evaluation.     ED Prescriptions     Medication Sig Dispense Auth. Provider   dicyclomine (BENTYL) 20 MG tablet Take 1 tablet (20 mg total) by mouth 3 (three) times daily as needed for spasms. 24 tablet [name], [name]      PDMP not reviewed this encounter.   [name], [name] 07/06/23 1539

## 2023-07-06 NOTE — ED Triage Notes (Addendum)
Pt c/o diarrhea since 12/24. Denies nausea or vomiting. No pmh of GI problems. Saw PCP on 12/26. Stool panel came back neg. Has not had f/u. No OTC meds tried.

## 2023-07-06 NOTE — Discharge Instructions (Addendum)
Advised patient GI profile 6, stool PCR was completely negative.  Advised may take Bentyl daily, as needed for diarrhea and to help formed stool.  Encouraged to increase daily water intake to 64 ounces per day while taking his medication.  Advised if symptoms worsen and/or unresolved please follow-up with PCP or here for further evaluation.

## 2023-07-08 NOTE — Progress Notes (Signed)
Robert Ward, so far the stool test was negative for any type of bacterial infection like E. coli or Salmonella etc.  I am hoping that this was maybe just a self-limited virus that seems to be improving but if you are still having diarrhea please let me know.

## 2023-07-18 ENCOUNTER — Other Ambulatory Visit: Payer: Self-pay | Admitting: Family Medicine

## 2023-07-18 DIAGNOSIS — E119 Type 2 diabetes mellitus without complications: Secondary | ICD-10-CM

## 2023-09-09 ENCOUNTER — Encounter: Payer: Self-pay | Admitting: Family Medicine

## 2023-09-09 DIAGNOSIS — E119 Type 2 diabetes mellitus without complications: Secondary | ICD-10-CM

## 2023-09-10 MED ORDER — ATORVASTATIN CALCIUM 10 MG PO TABS: 10.0000 mg | ORAL_TABLET | Freq: Every day | ORAL | 3 refills | Status: DC

## 2023-10-06 ENCOUNTER — Encounter: Payer: Self-pay | Admitting: Family Medicine

## 2023-10-06 DIAGNOSIS — E119 Type 2 diabetes mellitus without complications: Secondary | ICD-10-CM

## 2023-10-07 MED ORDER — DAPAGLIFLOZIN PRO-METFORMIN ER 10-1000 MG PO TB24
1.0000 | ORAL_TABLET | Freq: Every day | ORAL | 1 refills | Status: DC
Start: 2023-10-07 — End: 2024-04-21

## 2023-10-07 NOTE — Telephone Encounter (Signed)
 Requesting rx rf of xigudo Last written 10/08/2022 Last OV 05/13/2023 Upcoming appt = none

## 2023-11-24 ENCOUNTER — Encounter: Payer: Self-pay | Admitting: Family Medicine

## 2023-11-24 DIAGNOSIS — E119 Type 2 diabetes mellitus without complications: Secondary | ICD-10-CM

## 2023-11-25 ENCOUNTER — Other Ambulatory Visit: Payer: Self-pay

## 2023-11-25 DIAGNOSIS — E119 Type 2 diabetes mellitus without complications: Secondary | ICD-10-CM

## 2023-11-25 MED ORDER — BLOOD GLUCOSE TEST VI STRP: 1.0000 | ORAL_STRIP | Freq: Every day | 0 refills | Status: DC | PRN

## 2023-11-29 MED ORDER — CONTOUR NEXT TEST VI STRP: ORAL_STRIP | 99 refills | Status: DC

## 2023-11-29 NOTE — Addendum Note (Signed)
 Addended by: Doretha Ganja on: 11/29/2023 09:45 AM   Modules accepted: Orders

## 2023-12-04 MED ORDER — CONTOUR NEXT TEST VI STRP: ORAL_STRIP | 99 refills | Status: AC

## 2023-12-04 NOTE — Addendum Note (Signed)
 Addended by: Jovanie Verge H on: 12/04/2023 07:38 AM   Modules accepted: Orders

## 2023-12-05 ENCOUNTER — Encounter: Payer: Self-pay | Admitting: Family Medicine

## 2023-12-05 ENCOUNTER — Ambulatory Visit (INDEPENDENT_AMBULATORY_CARE_PROVIDER_SITE_OTHER): Payer: Self-pay | Admitting: Family Medicine

## 2023-12-05 VITALS — BP 113/64 | HR 60 | Ht 69.0 in | Wt 228.1 lb

## 2023-12-05 DIAGNOSIS — E119 Type 2 diabetes mellitus without complications: Secondary | ICD-10-CM

## 2023-12-05 DIAGNOSIS — L299 Pruritus, unspecified: Secondary | ICD-10-CM | POA: Diagnosis not present

## 2023-12-05 DIAGNOSIS — Z7984 Long term (current) use of oral hypoglycemic drugs: Secondary | ICD-10-CM

## 2023-12-05 LAB — POCT GLYCOSYLATED HEMOGLOBIN (HGB A1C): Hemoglobin A1C: 6.2 % — AB (ref 4.0–5.6)

## 2023-12-05 MED ORDER — ACETIC ACID 2 % OT SOLN: 6.0000 [drp] | Freq: Three times a day (TID) | OTIC | 0 refills | Status: AC | PRN

## 2023-12-05 NOTE — Assessment & Plan Note (Signed)
 A1C looks great today at 6.2. continue to work on portion control. He does well with protein intake.  F/U in 4 months.

## 2023-12-05 NOTE — Progress Notes (Unsigned)
   Established Patient Office Visit  Subjective  Patient ID: Robert Ward, male    DOB: Jun 17, 1961  Age: 63 y.o. MRN: [id number]  Chief Complaint  Patient presents with   Diabetes    HPI  Diabetes - no hypoglycemic events. No wounds or sores that are not healing well. No increased thirst or urination. Checking glucose at home. Taking medications as prescribed without any side effects.  He also complains of itchy ears.  Sometimes he starts scratching at them and digging at them and not even realizing it.  It is both ears.  {History (Optional):23778}  ROS    Objective:     There were no vitals taken for this visit. {Vitals History (Optional):23777}  Physical Exam Vitals and nursing note reviewed.  Constitutional:      Appearance: Normal appearance.  HENT:     Head: Normocephalic and atraumatic.     Right Ear: Tympanic membrane, ear canal and external ear normal.     Left Ear: Tympanic membrane, ear canal and external ear normal.     Ears:     Comments: Cerumen in both canals but not blocking the TM. Eyes:     Conjunctiva/sclera: Conjunctivae normal.  Cardiovascular:     Rate and Rhythm: Normal rate and regular rhythm.  Pulmonary:     Effort: Pulmonary effort is normal.     Breath sounds: Normal breath sounds.  Skin:    General: Skin is warm and dry.  Neurological:     Mental Status: He is alert.  Psychiatric:        Mood and Affect: Mood normal.      Results for orders placed or performed in visit on 12/05/23  HM DIABETES EYE EXAM  Result Value Ref Range   HM Diabetic Eye Exam No Retinopathy No Retinopathy  Results for orders placed or performed in visit on 12/05/23  POCT HgB A1C  Result Value Ref Range   Hemoglobin A1C 6.2 (A) 4.0 - 5.6 %   HbA1c POC (<> result, manual entry)     HbA1c, POC (prediabetic range)     HbA1c, POC (controlled diabetic range)      {Labs (Optional):23779}  The 10-year ASCVD risk score (Arnett DK, et al., 2019) is:  24.6%    Assessment & Plan:   Problem List Items Addressed This Visit       Endocrine   Controlled type 2 diabetes mellitus without complication, without long-term current use of insulin (HCC) - Primary   A1C looks great today at 6.2. continue to work on portion control. He does well with protein intake.  F/U in 4 months.        Relevant Orders   POCT HgB A1C (Completed)   CMP14+EGFR   Lipid panel   CBC   Other Visit Diagnoses       Itching of ear       Relevant Medications   acetic acid 2 % otic solution      Itchy ears-we discussed a trial of VoSoL.  He does not overuse Q-tips or over excessively clean the ears which is good. Return in about 4 months (around 04/07/2024) for Wellness Exam.    Duaine German, MD

## 2023-12-06 ENCOUNTER — Ambulatory Visit: Payer: Self-pay | Admitting: Family Medicine

## 2023-12-06 LAB — CBC
Hematocrit: 50.3 % (ref 37.5–51.0)
Hemoglobin: 16.3 g/dL (ref 13.0–17.7)
MCH: 29.7 pg (ref 26.6–33.0)
MCHC: 32.4 g/dL (ref 31.5–35.7)
MCV: 92 fL (ref 79–97)
Platelets: 221 10*3/uL (ref 150–450)
RBC: 5.48 x10E6/uL (ref 4.14–5.80)
RDW: 13.5 % (ref 11.6–15.4)
WBC: 11.2 10*3/uL — ABNORMAL HIGH (ref 3.4–10.8)

## 2023-12-06 LAB — LIPID PANEL
Chol/HDL Ratio: 4.8 ratio (ref 0.0–5.0)
Cholesterol, Total: 145 mg/dL (ref 100–199)
HDL: 30 mg/dL — ABNORMAL LOW (ref >39)
LDL Chol Calc (NIH): 66 mg/dL (ref 0–99)
Triglycerides: 306 mg/dL — ABNORMAL HIGH (ref 0–149)
VLDL Cholesterol Cal: 49 mg/dL — ABNORMAL HIGH (ref 5–40)

## 2023-12-06 LAB — CMP14+EGFR
ALT: 24 IU/L (ref 0–44)
AST: 17 IU/L (ref 0–40)
Albumin: 4.5 g/dL (ref 3.9–4.9)
Alkaline Phosphatase: 80 IU/L (ref 44–121)
BUN/Creatinine Ratio: 11 (ref 10–24)
BUN: 10 mg/dL (ref 8–27)
Bilirubin Total: 0.5 mg/dL (ref 0.0–1.2)
CO2: 22 mmol/L (ref 20–29)
Calcium: 9.7 mg/dL (ref 8.6–10.2)
Chloride: 101 mmol/L (ref 96–106)
Creatinine, Ser: 0.89 mg/dL (ref 0.76–1.27)
Globulin, Total: 2.1 g/dL (ref 1.5–4.5)
Glucose: 81 mg/dL (ref 70–99)
Potassium: 4.6 mmol/L (ref 3.5–5.2)
Sodium: 141 mmol/L (ref 134–144)
Total Protein: 6.6 g/dL (ref 6.0–8.5)
eGFR: 97 mL/min/{1.73_m2} (ref >59)

## 2023-12-06 NOTE — Progress Notes (Signed)
 [place], LDL looks great this time under 70 which is perfect.  Triglycerides are still high so still continue to work on some dietary changes.  If you also want to add in a daily fish oil tab that can be helpful to lower triglycerides as well as well as regular exercise for 20 minutes 5 days a week.  Blood count looks great.  You always have just a slight really elevated white blood cell count but it is pretty stable.  Metabolic panel looks great including liver and kidney function.

## 2023-12-11 NOTE — Telephone Encounter (Signed)
 Spoke with [facility] . Was told the contour next test strips are non-formulary. Reached out to patient and made him aware. He will reach out to his insurance company and find out what the preferred glucometer and testing strips are with his insurance. He will send a Mychart message to let us  know so that we can send the correct supplies  to [facility] at that time.

## 2024-04-06 ENCOUNTER — Encounter: Payer: Self-pay | Admitting: Family Medicine

## 2024-04-06 ENCOUNTER — Ambulatory Visit: Admitting: Family Medicine

## 2024-04-06 VITALS — BP 108/61 | HR 59 | Ht 69.0 in | Wt 238.1 lb

## 2024-04-06 DIAGNOSIS — Z0000 Encounter for general adult medical examination without abnormal findings: Secondary | ICD-10-CM | POA: Diagnosis not present

## 2024-04-06 DIAGNOSIS — E119 Type 2 diabetes mellitus without complications: Secondary | ICD-10-CM

## 2024-04-06 DIAGNOSIS — Z7984 Long term (current) use of oral hypoglycemic drugs: Secondary | ICD-10-CM | POA: Diagnosis not present

## 2024-04-06 DIAGNOSIS — Z125 Encounter for screening for malignant neoplasm of prostate: Secondary | ICD-10-CM

## 2024-04-06 NOTE — Progress Notes (Signed)
 Complete physical exam  Patient: Robert Ward   DOB: [birth year]   63 y.o. Male  MRN: [id number]  Subjective:    Chief Complaint  Patient presents with   Annual Exam    ALOIS COLGAN is a 63 y.o. male who presents today for a complete physical exam. He reports consuming a general diet. Walks about 8-000-10000 step per day  He generally feels well. He reports sleeping fairly well. He does not have additional problems to discuss today.    Most recent fall risk assessment:    04/06/2024    7:11 AM  Fall Risk   Falls in the past year? 0  Number falls in past yr: 0  Injury with Fall? 0  Risk for fall due to : No Fall Risks  Follow up Falls evaluation completed     Most recent depression screenings:    04/06/2024    7:11 AM 12/05/2023    2:18 PM  PHQ 2/9 Scores  PHQ - 2 Score 0 0  PHQ- 9 Score 2          Patient Care Team: Alvan Dorothyann BIRCH, MD as PCP - [facility]erring [facility] (Ophthalmology)   Outpatient Medications Prior to Visit  Medication Sig   acetic acid  2 % otic solution Place 6 drops into both ears 3 (three) times daily as needed.   atorvastatin  (LIPITOR) 10 MG tablet Take 1 tablet (10 mg total) by mouth at bedtime.   Dapagliflozin  Pro-metFORMIN  ER (XIGDUO  XR) 04-999 MG TB24 Take 1 tablet by mouth daily.   glucose blood (CONTOUR NEXT TEST) test strip Check blood glucose up to 3 times daily.   [DISCONTINUED] dicyclomine  (BENTYL ) 20 MG tablet Take 1 tablet (20 mg total) by mouth 3 (three) times daily as needed for spasms.   No facility-administered medications prior to visit.    ROS        Objective:     BP 108/61   Pulse (!) 59   Ht 5' 9 (1.753 m)   Wt 238 lb 1.3 oz (108 kg)   SpO2 96%   BMI 35.16 kg/m     Physical Exam Constitutional:      Appearance: Normal appearance.  HENT:     Head: Normocephalic and atraumatic.     Right Ear: Tympanic membrane, ear canal and external ear normal.     Left Ear: Tympanic  membrane, ear canal and external ear normal.     Nose: Nose normal.     Mouth/Throat:     Pharynx: Oropharynx is clear.  Eyes:     Extraocular Movements: Extraocular movements intact.     Conjunctiva/sclera: Conjunctivae normal.     Pupils: Pupils are equal, round, and reactive to light.  Neck:     Thyroid : No thyromegaly.  Cardiovascular:     Rate and Rhythm: Normal rate and regular rhythm.  Pulmonary:     Effort: Pulmonary effort is normal.     Breath sounds: Normal breath sounds.  Abdominal:     General: Bowel sounds are normal.     Palpations: Abdomen is soft.     Tenderness: There is no abdominal tenderness.  Musculoskeletal:        General: No swelling.     Cervical back: Neck supple.  Skin:    General: Skin is warm and dry.  Neurological:     Mental Status: He is oriented to person, place, and time.  Psychiatric:        Mood  and Affect: Mood normal.        Behavior: Behavior normal.      No results found for any visits on 04/06/24.      Assessment & Plan:    Routine Health Maintenance and Physical Exam  Immunization History  Administered Date(s) Administered   Hep A / Hep B 11/30/2017, 12/31/2017, 06/03/2018   PFIZER(Purple Top)SARS-COV-2 Vaccination 10/05/2019, 10/28/2019   PNEUMOCOCCAL CONJUGATE-20 07/04/2021   PPD Test 07/17/2022   Td 07/09/1997, 10/26/2008   Tdap 12/02/2020   Typhoid Inactivated 11/30/2017   Zoster Recombinant(Shingrix ) 04/04/2018, 08/08/2018    Health Maintenance  Topic Date Due   COVID-19 Vaccine (3 - 2025-26 season) 03/09/2024   Influenza Vaccine  10/06/2024 (Originally 02/07/2024)   Diabetic kidney evaluation - Urine ACR  05/12/2024   HEMOGLOBIN A1C  06/06/2024   OPHTHALMOLOGY EXAM  06/19/2024   Diabetic kidney evaluation - eGFR measurement  12/04/2024   FOOT EXAM  12/04/2024   DTaP/Tdap/Td (4 - Td or Tdap) 12/03/2030   Colonoscopy  04/17/2032   Pneumococcal Vaccine: 50+ Years  Completed   Hepatitis B Vaccines 19-59 Average  Risk  Completed   Hepatitis C Screening  Completed   HIV Screening  Completed   Zoster Vaccines- Shingrix   Completed   HPV VACCINES  Aged Out   Meningococcal B Vaccine  Aged Out    Discussed health benefits of physical activity, and encouraged him to engage in regular exercise appropriate for his age and condition.  Problem List Items Addressed This Visit       Endocrine   Controlled type 2 diabetes mellitus without complication, without long-term current use of insulin (HCC)   Relevant Orders   HgB A1c   B12   Other Visit Diagnoses       Routine general medical examination at a health care facility    -  Primary   Relevant Orders   Urine Microalbumin w/creat. ratio   HgB A1c   PSA   CMP14+EGFR   B12     Screening PSA (prostate specific antigen)       Relevant Orders   PSA       Keep up a regular exercise program and make sure you are eating a healthy diet Get updated labs Declined flu exam.    Did have one episode of lightheadedness.  Used to pass out when he was a teenager.  Hasn't happened for years.   Return in about 4 months (around 08/11/2024) for Diabetes follow-up.     Dorothyann Byars, MD

## 2024-04-07 ENCOUNTER — Ambulatory Visit: Payer: Self-pay | Admitting: Family Medicine

## 2024-04-07 LAB — CMP14+EGFR
ALT: 33 IU/L (ref 0–44)
AST: 27 IU/L (ref 0–40)
Albumin: 4.3 g/dL (ref 3.9–4.9)
Alkaline Phosphatase: 86 IU/L (ref 47–123)
BUN/Creatinine Ratio: 24 (ref 10–24)
BUN: 21 mg/dL (ref 8–27)
Bilirubin Total: 0.3 mg/dL (ref 0.0–1.2)
CO2: 23 mmol/L (ref 20–29)
Calcium: 8.8 mg/dL (ref 8.6–10.2)
Chloride: 104 mmol/L (ref 96–106)
Creatinine, Ser: 0.87 mg/dL (ref 0.76–1.27)
Globulin, Total: 2 g/dL (ref 1.5–4.5)
Glucose: 125 mg/dL — ABNORMAL HIGH (ref 70–99)
Potassium: 4.2 mmol/L (ref 3.5–5.2)
Sodium: 142 mmol/L (ref 134–144)
Total Protein: 6.3 g/dL (ref 6.0–8.5)
eGFR: 98 mL/min/1.73 (ref >59)

## 2024-04-07 LAB — MICROALBUMIN / CREATININE URINE RATIO
Creatinine, Urine: 265.8 mg/dL
Microalb/Creat Ratio: 4 mg/g{creat} (ref 0–29)
Microalbumin, Urine: 11.6 ug/mL

## 2024-04-07 LAB — PSA: Prostate Specific Ag, Serum: 0.3 ng/mL (ref 0.0–4.0)

## 2024-04-07 LAB — HEMOGLOBIN A1C
Est. average glucose Bld gHb Est-mCnc: 148 mg/dL
Hgb A1c MFr Bld: 6.8 % — ABNORMAL HIGH (ref 4.8–5.6)

## 2024-04-07 LAB — SPECIMEN STATUS REPORT

## 2024-04-07 LAB — VITAMIN B12: Vitamin B-12: 448 pg/mL (ref 232–1245)

## 2024-04-07 NOTE — Progress Notes (Signed)
 Hi Quintan,  Your A1C is up to 6.8, was 6.2 last time.  Just encourage you to work on diet and increasing exercise.  Metabolic panel is normal.  No excess protein in the urine. PSA stable.  Vit B12 is normal.

## 2024-04-20 ENCOUNTER — Other Ambulatory Visit: Payer: Self-pay | Admitting: Family Medicine

## 2024-04-20 DIAGNOSIS — E119 Type 2 diabetes mellitus without complications: Secondary | ICD-10-CM

## 2024-05-19 LAB — OPHTHALMOLOGY REPORT-SCANNED

## 2024-07-06 ENCOUNTER — Other Ambulatory Visit: Payer: Self-pay | Admitting: Family Medicine

## 2024-07-06 DIAGNOSIS — E119 Type 2 diabetes mellitus without complications: Secondary | ICD-10-CM

## 2024-08-10 ENCOUNTER — Ambulatory Visit: Admitting: Family Medicine

## 2024-09-15 ENCOUNTER — Ambulatory Visit: Admitting: Family Medicine
